# Patient Record
Sex: Female | Born: 1990 | Race: Black or African American | Hispanic: No | Marital: Married | Smoking: Never smoker
Health system: Southern US, Community
[De-identification: ages and names within clinical notes are randomized; demographics above are authoritative.]

## PROBLEM LIST (undated history)

## (undated) ENCOUNTER — Emergency Department (HOSPITAL_COMMUNITY): Disposition: A | Discharge status: Home / Self Care | Payer: Self-pay

## (undated) DIAGNOSIS — D219 Benign neoplasm of connective and other soft tissue, unspecified: Secondary | ICD-10-CM

## (undated) DIAGNOSIS — A6009 Herpesviral infection of other urogenital tract: Secondary | ICD-10-CM

## (undated) HISTORY — PX: NO PAST SURGERIES: SHX2092

---

## 2012-09-06 ENCOUNTER — Emergency Department (INDEPENDENT_AMBULATORY_CARE_PROVIDER_SITE_OTHER)
Admission: EM | Admit: 2012-09-06 | Discharge: 2012-09-06 | Disposition: A | Discharge status: Home / Self Care | Payer: Medicaid Other | Source: Home / Self Care | Attending: Emergency Medicine | Admitting: Emergency Medicine

## 2012-09-06 ENCOUNTER — Encounter (HOSPITAL_COMMUNITY): Payer: Self-pay

## 2012-09-06 ENCOUNTER — Other Ambulatory Visit: Payer: Self-pay | Admitting: Emergency Medicine

## 2012-09-06 ENCOUNTER — Other Ambulatory Visit (HOSPITAL_COMMUNITY)
Admission: RE | Admit: 2012-09-06 | Discharge: 2012-09-06 | Disposition: A | Discharge status: Home / Self Care | Payer: Medicaid Other | Source: Ambulatory Visit | Attending: Emergency Medicine | Admitting: Emergency Medicine

## 2012-09-06 DIAGNOSIS — N760 Acute vaginitis: Secondary | ICD-10-CM | POA: Insufficient documentation

## 2012-09-06 DIAGNOSIS — Z113 Encounter for screening for infections with a predominantly sexual mode of transmission: Secondary | ICD-10-CM | POA: Insufficient documentation

## 2012-09-06 LAB — POCT URINALYSIS DIP (DEVICE)
Glucose, UA: NEGATIVE mg/dL
Nitrite: NEGATIVE
Urobilinogen, UA: 1 mg/dL (ref 0.0–1.0)

## 2012-09-06 MED ORDER — AZITHROMYCIN 250 MG PO TABS
ORAL_TABLET | ORAL | Status: AC
  Filled 2012-09-06: qty 4

## 2012-09-06 MED ORDER — MICONAZOLE NITRATE 100-2 MG-% VA KIT: 1.0000 | PACK | Freq: Once | VAGINAL | Status: DC

## 2012-09-06 MED ORDER — METRONIDAZOLE 500 MG PO TABS: 500.0000 mg | ORAL_TABLET | Freq: Two times a day (BID) | ORAL | Status: DC

## 2012-09-06 MED ORDER — AZITHROMYCIN 250 MG PO TABS
1000.0000 mg | ORAL_TABLET | Freq: Once | ORAL | Status: AC
  Administered 2012-09-06: 1000 mg via ORAL

## 2012-09-06 NOTE — ED Provider Notes (Signed)
CSN: [id number]     Arrival date & time 09/06/12  0915 History   First MD Initiated Contact with Patient 09/06/12 1003     Chief Complaint  Patient presents with  . Vaginitis   (Consider location/radiation/quality/duration/timing/severity/associated sxs/prior Treatment) The history is provided by the patient. No language interpreter was used.  C/O VAGINAL ITCHING X 1 WEEK DENIES DYSURIA DENIES STD EXPOSURE JUST HAD HER MENSTRUAL PERIOD LAST WEEK  History reviewed. No pertinent past medical history. History reviewed. No pertinent past surgical history. No family history on file. History  Substance Use Topics  . Smoking status: Not on file  . Smokeless tobacco: Not on file  . Alcohol Use: Not on file   OB History   Grav Para Term Preterm Abortions TAB SAB Ect Mult Living                 Review of Systems  Constitutional: Negative.   HENT: Negative.   Eyes: Negative.   Respiratory: Negative.   Cardiovascular: Negative.   Gastrointestinal: Negative.   Endocrine: Negative.   Genitourinary: Positive for vaginal discharge.       C/O VAGINAL ITCHING AND DISCHARGE  Musculoskeletal: Negative.   Neurological: Negative.   Hematological: Negative.   Psychiatric/Behavioral: Negative.   All other systems reviewed and are negative.    Allergies  Penicillins  Home Medications   Current Outpatient Rx  Name  Route  Sig  Dispense  Refill  . metroNIDAZOLE (FLAGYL) 500 MG tablet   Oral   Take 1 tablet (500 mg total) by mouth 2 (two) times daily.   14 tablet   0   . Miconazole Nitrate (MONISTAT 7) 100-2 MG-% KIT   Vaginal   Place 1 Applicatorful vaginally once.   1 kit   0    BP 107/68  Pulse 84  Temp(Src) 98.3 F (36.8 C) (Oral)  Resp 14  SpO2 99%  LMP 09/01/2012 Physical Exam  Nursing note and vitals reviewed. Constitutional: She is oriented to person, place, and time. She appears well-developed and well-nourished.  HENT:  Head: Normocephalic and atraumatic.   Mouth/Throat: Oropharynx is clear and moist.  Eyes: Conjunctivae are normal. Pupils are equal, round, and reactive to light.  Neck: Normal range of motion. Neck supple.  Cardiovascular: Normal rate, regular rhythm, normal heart sounds and intact distal pulses.   No murmur heard. Pulmonary/Chest: Effort normal and breath sounds normal.  Abdominal: Soft. Bowel sounds are normal. She exhibits no distension and no mass. There is no tenderness.  Genitourinary:  NORMAL EXTERNAL GENITALIA RESIDUAL DARK MENSTRUAL BLOOD  MINIMAL IN VAULT CERVIX NORMAL NO DISCHARGE UTERUS SMALL,NONTENDER NO ADNEXAL MASS OR TENDERNESS  Musculoskeletal: Normal range of motion.  Neurological: She is alert and oriented to person, place, and time. No cranial nerve deficit. She exhibits normal muscle tone. Coordination normal.  Skin: Skin is warm and dry.  Psychiatric: She has a normal mood and affect.    ED Course  Procedures (including critical care time) Labs Review Labs Reviewed  POCT URINALYSIS DIP (DEVICE) - Abnormal; Notable for the following:    Hgb urine dipstick MODERATE (*)    Leukocytes, UA MODERATE (*)    All other components within normal limits  POCT PREGNANCY, URINE  CERVICOVAGINAL ANCILLARY ONLY   Imaging Review No results found.  MDM   1. Vaginitis       Duwayne Heck de Marcello Moores, MD 09/06/12 1200

## 2012-09-06 NOTE — ED Notes (Signed)
Call back number for lab issues verified 

## 2012-09-06 NOTE — ED Notes (Signed)
Uses tampons , and since her last cycle, has been having odor and itching; states her partner has not had issues; NAD

## 2012-09-09 NOTE — ED Notes (Signed)
GC/Chlamydia neg., Affirm: Candida pos., Gardnerella and Trich neg. Pt. adequately treated with Monistat -7. Lynn Cardenas 09/09/2012

## 2013-03-24 ENCOUNTER — Emergency Department (HOSPITAL_COMMUNITY)
Admission: EM | Admit: 2013-03-24 | Discharge: 2013-03-24 | Disposition: A | Discharge status: Home / Self Care | Payer: Medicaid Other | Source: Home / Self Care | Attending: Family Medicine | Admitting: Family Medicine

## 2013-03-24 ENCOUNTER — Encounter (HOSPITAL_COMMUNITY): Payer: Self-pay | Admitting: Emergency Medicine

## 2013-03-24 ENCOUNTER — Other Ambulatory Visit (HOSPITAL_COMMUNITY)
Admission: RE | Admit: 2013-03-24 | Discharge: 2013-03-24 | Disposition: A | Discharge status: Home / Self Care | Payer: Medicaid Other | Source: Ambulatory Visit | Attending: Family Medicine | Admitting: Family Medicine

## 2013-03-24 DIAGNOSIS — N898 Other specified noninflammatory disorders of vagina: Secondary | ICD-10-CM

## 2013-03-24 DIAGNOSIS — Z113 Encounter for screening for infections with a predominantly sexual mode of transmission: Secondary | ICD-10-CM | POA: Insufficient documentation

## 2013-03-24 DIAGNOSIS — N760 Acute vaginitis: Secondary | ICD-10-CM | POA: Insufficient documentation

## 2013-03-24 MED ORDER — FLUCONAZOLE 150 MG PO TABS: 150.0000 mg | ORAL_TABLET | Freq: Once | ORAL | Status: DC

## 2013-03-24 MED ORDER — FLUCONAZOLE 150 MG PO TABS: 150.0000 mg | ORAL_TABLET | Freq: Every day | ORAL | Status: DC

## 2013-03-24 NOTE — ED Provider Notes (Signed)
CSN: [id number]     Arrival date & time 03/24/13  1159 History   First MD Initiated Contact with Patient 03/24/13 1343     Chief Complaint  Patient presents with  . Vaginitis   (Consider location/radiation/quality/duration/timing/severity/associated sxs/prior Treatment) Patient is a 23 y.o. female presenting with vaginal itching. The history is provided by the patient.  Vaginal Itching This is a new problem. The current episode started 2 days ago. The problem occurs constantly. The problem has not changed since onset.Nothing aggravates the symptoms. Nothing relieves the symptoms.   Lynn Cardenas is a 23 y.o. female who presents to the UC with vaginal swelling, itching and discharge after having sex and using a latex condom 2 days ago. She has been with her current sex partner x 4 months and uses condoms every time. She thinks may this time it was a different kind of condom. She denies n/v, abdominal pain or any other problems.   History reviewed. No pertinent past medical history. History reviewed. No pertinent past surgical history. History reviewed. No pertinent family history. History  Substance Use Topics  . Smoking status: Not on file  . Smokeless tobacco: Not on file  . Alcohol Use: Not on file   OB History   Grav Para Term Preterm Abortions TAB SAB Ect Mult Living                 Review of Systems Negative except as stated in HPI Allergies  Penicillins  Home Medications   Current Outpatient Rx  Name  Route  Sig  Dispense  Refill  . fluconazole (DIFLUCAN) 150 MG tablet   Oral   Take 1 tablet (150 mg total) by mouth daily.   1 tablet   0   . metroNIDAZOLE (FLAGYL) 500 MG tablet   Oral   Take 1 tablet (500 mg total) by mouth 2 (two) times daily.   14 tablet   0   . Miconazole Nitrate (MONISTAT 7) 100-2 MG-% KIT   Vaginal   Place 1 Applicatorful vaginally once.   1 kit   0    BP 98/60  Pulse 67  Temp(Src) 98.2 F (36.8 C) (Oral)  Resp 16  SpO2 100%   LMP 03/03/2013 Physical Exam  Nursing note and vitals reviewed. Constitutional: She is oriented to person, place, and time. She appears well-developed and well-nourished. No distress.  HENT:  Head: Normocephalic.  Eyes: EOM are normal.  Neck: Neck supple.  Cardiovascular: Normal rate.   Pulmonary/Chest: Effort normal.  Abdominal: Soft. There is no tenderness.  Genitourinary:  External genitalia without lesions, no swelling noted, thick white discharge vaginal vault. No CMT, no adnexal tenderness. Uterus without palpable enlargement.   Musculoskeletal: Normal range of motion.  Neurological: She is alert and oriented to person, place, and time. No cranial nerve deficit.  Skin: Skin is warm and dry.  Psychiatric: She has a normal mood and affect. Her behavior is normal.    ED Course  Procedures  MDM  23 y.o. female with vaginal irritation and vaginal discharge. Will treat with Diflucan while wet prep and cultures pending.  Discussed with the patient and all questioned fully answered. She will follow up with her GYN or return here if any problems arise. Stable for discharge without further screening indicated at this time  1. Vaginal discharge        [facility], NP 03/24/13 1414

## 2013-03-24 NOTE — ED Provider Notes (Signed)
Medical screening examination/treatment/procedure(s) were performed by resident physician or non-physician practitioner and as supervising physician I was immediately available for consultation/collaboration.   Pauline Good MD.   Billy Fischer, MD 03/24/13 [phone]

## 2013-03-24 NOTE — Discharge Instructions (Signed)
Your exam today shows minimal swelling of the vaginal area. The condom that was used when you had sex may have caused irritation to the vaginal area. There is a thick white discharge that may be a yeast infection. I am giving you medication that will treat yeast. Follow up with your GYN. If your cultures show that you need additional medications someone will call you.

## 2013-03-24 NOTE — ED Notes (Signed)
C/o yeast infection States she is not having any sx but area is swelling Has had sex recently Has used monistat but no relief.

## 2013-03-25 LAB — CERVICOVAGINAL ANCILLARY ONLY
Chlamydia: NEGATIVE
NEISSERIA GONORRHEA: NEGATIVE
WET PREP (BD AFFIRM): NEGATIVE
WET PREP (BD AFFIRM): POSITIVE — AB
Wet Prep (BD Affirm): NEGATIVE

## 2013-03-26 NOTE — ED Notes (Addendum)
Gc/Chlamydia neg., Affirm: Candida and Trich neg., Gardnerella pos.  Message sent to Dr. Juventino Slovak and Debroah Baller NP. Lynn Cardenas 03/26/2013 3/19 Discussed with Dr. Juventino Slovak and he said no further action needed. 03/27/2013

## 2013-03-29 ENCOUNTER — Telehealth (HOSPITAL_COMMUNITY): Payer: Self-pay | Admitting: *Deleted

## 2013-03-29 NOTE — ED Notes (Signed)
Lynn Baller NP ordered Flagyl.  I called pt.  Pt. verified x 2 and given results.  Pt. told she needs Flagyl for bacterial vaginosis.   Pt. instructed to no alcohol while taking this medication.  Pt. wants Rx. called to [facility] on [address]. I called Rx. to pharmacy VM @ [phone] because the pharmacy was closed. Lynn Cardenas 03/29/2013

## 2013-12-11 ENCOUNTER — Other Ambulatory Visit: Payer: Self-pay | Admitting: Emergency Medicine

## 2013-12-11 DIAGNOSIS — R102 Pelvic and perineal pain: Secondary | ICD-10-CM

## 2013-12-11 DIAGNOSIS — N940 Mittelschmerz: Secondary | ICD-10-CM

## 2013-12-15 ENCOUNTER — Ambulatory Visit
Admission: RE | Admit: 2013-12-15 | Discharge: 2013-12-15 | Disposition: A | Discharge status: Home / Self Care | Payer: Medicaid Other | Source: Ambulatory Visit | Attending: Emergency Medicine | Admitting: Emergency Medicine

## 2013-12-15 ENCOUNTER — Other Ambulatory Visit: Payer: Medicaid Other

## 2013-12-15 ENCOUNTER — Inpatient Hospital Stay: Admission: RE | Admit: 2013-12-15 | Payer: Medicaid Other | Source: Ambulatory Visit

## 2013-12-15 DIAGNOSIS — N940 Mittelschmerz: Secondary | ICD-10-CM

## 2013-12-15 DIAGNOSIS — R102 Pelvic and perineal pain unspecified side: Secondary | ICD-10-CM

## 2014-10-27 ENCOUNTER — Emergency Department (INDEPENDENT_AMBULATORY_CARE_PROVIDER_SITE_OTHER)
Admission: EM | Admit: 2014-10-27 | Discharge: 2014-10-27 | Disposition: A | Discharge status: Home / Self Care | Payer: Self-pay | Source: Home / Self Care | Attending: Family Medicine | Admitting: Family Medicine

## 2014-10-27 ENCOUNTER — Encounter (HOSPITAL_COMMUNITY): Payer: Self-pay | Admitting: Emergency Medicine

## 2014-10-27 ENCOUNTER — Emergency Department (INDEPENDENT_AMBULATORY_CARE_PROVIDER_SITE_OTHER): Payer: Self-pay

## 2014-10-27 DIAGNOSIS — R3589 Other polyuria: Secondary | ICD-10-CM

## 2014-10-27 DIAGNOSIS — R358 Other polyuria: Secondary | ICD-10-CM

## 2014-10-27 DIAGNOSIS — R109 Unspecified abdominal pain: Secondary | ICD-10-CM

## 2014-10-27 LAB — POCT URINALYSIS DIP (DEVICE)
BILIRUBIN URINE: NEGATIVE
GLUCOSE, UA: NEGATIVE mg/dL
Hgb urine dipstick: NEGATIVE
KETONES UR: NEGATIVE mg/dL
Leukocytes, UA: NEGATIVE
NITRITE: NEGATIVE
PH: 7.5 (ref 5.0–8.0)
Protein, ur: NEGATIVE mg/dL
Specific Gravity, Urine: 1.015 (ref 1.005–1.030)
Urobilinogen, UA: 0.2 mg/dL (ref 0.0–1.0)

## 2014-10-27 LAB — POCT PREGNANCY, URINE: PREG TEST UR: NEGATIVE

## 2014-10-27 MED ORDER — NAPROXEN 500 MG PO TABS
500.0000 mg | ORAL_TABLET | Freq: Two times a day (BID) | ORAL | Status: DC
Start: 2014-10-27 — End: 2019-12-16

## 2014-10-27 NOTE — ED Provider Notes (Signed)
CSN: [id number]     Arrival date & time 10/27/14  1541 History   First MD Initiated Contact with Patient 10/27/14 1746     Chief Complaint  Patient presents with  . Urinary Tract Infection  . Abdominal Pain   (Consider location/radiation/quality/duration/timing/severity/associated sxs/prior Treatment) Patient is a 24 y.o. female presenting with urinary tract infection and abdominal pain. The history is provided by the patient. No language interpreter was used.  Urinary Tract Infection Associated symptoms: abdominal pain   Abdominal Pain Patient presents with complait of 2 weeks' duration urinary frequency and feeling incomplete emptying, accompanied by L sided abdominal pain that is worse when she voids.  Has had no fever/chills, no dysuria and no vaginal discharge. No prior history of UTI. The abd pain is gnawing and not sharp, is not affected by position.  No associated N/V/D.  Has not seen blood in urine. LMP 09/26/2014, usual timing for her.  She has no abd surgical history.  She has a history of ovarian cyst in the past, which was much sharper pain than she has now.  At the present moment she is pain-free.   No history of renal calculi.  She takes no medications, did take an alka seltzer 2 weeks ago (x1) for cold symptoms.  Allergy to PCN (only medication allergy).   History reviewed. No pertinent past medical history. History reviewed. No pertinent past surgical history. History reviewed. No pertinent family history. Social History  Substance Use Topics  . Smoking status: None  . Smokeless tobacco: None  . Alcohol Use: None   OB History    No data available     Review of Systems  Gastrointestinal: Positive for abdominal pain.    Allergies  Penicillins  Home Medications   Prior to Admission medications   Medication Sig Start Date End Date Taking? Authorizing Provider  fluconazole (DIFLUCAN) 150 MG tablet Take 1 tablet (150 mg total) by mouth daily. 03/24/13   Hope Bunnie Pion, NP  metroNIDAZOLE (FLAGYL) 500 MG tablet Take 1 tablet (500 mg total) by mouth 2 (two) times daily. 09/06/12   Quentin Angst de [place], MD  Miconazole Nitrate (MONISTAT 7) 100-2 MG-% KIT Place 1 Applicatorful vaginally once. 09/06/12   [place], MD   Meds Ordered and Administered this Visit  Medications - No data to display  BP 105/67 mmHg  Pulse 73  Temp(Src) 98.6 F (37 C) (Oral)  Resp 18  SpO2 99%  LMP 09/26/2014 No data found.   Physical Exam  Constitutional: She appears well-developed and well-nourished. No distress.  HENT:  Head: Normocephalic.  Mouth/Throat: Oropharynx is clear and moist. No oropharyngeal exudate.  Eyes: Conjunctivae are normal. Pupils are equal, round, and reactive to light. Right eye exhibits no discharge. Left eye exhibits no discharge. No scleral icterus.  Neck: Normal range of motion. Neck supple. No thyromegaly present.  Cardiovascular: Normal rate, regular rhythm and normal heart sounds.   No murmur heard. Pulmonary/Chest: Effort normal and breath sounds normal. No respiratory distress. She has no wheezes. She has no rales. She exhibits no tenderness.  Abdominal: Soft. Bowel sounds are normal.  Left sided CVA tenderness noted.  No masses or megaly.  Tenderness (mild) to deep palpation along LLQ.  No rebound.  No suprapubic tenderness.   Lymphadenopathy:    She has no cervical adenopathy.  Skin: She is not diaphoretic.    ED Course  Procedures (including critical care time)  Labs Review Labs Reviewed  POCT  URINALYSIS DIP (DEVICE)  POCT PREGNANCY, URINE    Imaging Review No results found.   Visual Acuity Review  Right Eye Distance:   Left Eye Distance:   Bilateral Distance:    Right Eye Near:   Left Eye Near:    Bilateral Near:         MDM   1. Left sided abdominal pain   2. Polyuria    Patient with polyuria and normal UA in UCC today; the combination of her urinary symptoms and LLQ abdominal pain  (intermittent) with some mild CVA tenderness raises concerns for renal calculi.  KUB in [place] today to see if we can visualize radio-opaque stone.  If not, does not exclude diagnosis of renal stones. WIll recommend increased hydration and Naproxen 544m twice daily for pain. Patient is to establish with primary care provider, [facility]     JWilleen Niece MD 10/27/14 [phone]

## 2014-10-27 NOTE — ED Notes (Signed)
The patient presented to the [facility] with a complaint of a possible UTI and stomach pain that has been ongoing for 2 weeks.

## 2014-10-27 NOTE — Discharge Instructions (Signed)
It was a pleasure to see you today.  The x-ray does not show evidence of a kidney stone.   I recommend increased fluid intake; use of Naproxen 500mg  tablet, 1 by mouth every 12 hours with food, as needed.   I recommend establishing care with a primary care doctor.   Please return to the [facility] or the ED if you experience worsening of the pain, nausea/vomiting, fevers, or with other changes or concerns.

## 2018-11-11 DIAGNOSIS — Z139 Encounter for screening, unspecified: Secondary | ICD-10-CM | POA: Diagnosis not present

## 2018-11-11 DIAGNOSIS — E559 Vitamin D deficiency, unspecified: Secondary | ICD-10-CM | POA: Diagnosis not present

## 2018-11-11 DIAGNOSIS — Z01419 Encounter for gynecological examination (general) (routine) without abnormal findings: Secondary | ICD-10-CM | POA: Diagnosis not present

## 2018-11-11 DIAGNOSIS — Z3040 Encounter for surveillance of contraceptives, unspecified: Secondary | ICD-10-CM | POA: Diagnosis not present

## 2018-11-13 DIAGNOSIS — M9902 Segmental and somatic dysfunction of thoracic region: Secondary | ICD-10-CM | POA: Diagnosis not present

## 2018-11-13 DIAGNOSIS — M7541 Impingement syndrome of right shoulder: Secondary | ICD-10-CM | POA: Diagnosis not present

## 2018-11-13 DIAGNOSIS — M542 Cervicalgia: Secondary | ICD-10-CM | POA: Diagnosis not present

## 2018-11-13 DIAGNOSIS — M9901 Segmental and somatic dysfunction of cervical region: Secondary | ICD-10-CM | POA: Diagnosis not present

## 2018-11-17 DIAGNOSIS — M542 Cervicalgia: Secondary | ICD-10-CM | POA: Diagnosis not present

## 2018-11-17 DIAGNOSIS — M9902 Segmental and somatic dysfunction of thoracic region: Secondary | ICD-10-CM | POA: Diagnosis not present

## 2018-11-17 DIAGNOSIS — M9901 Segmental and somatic dysfunction of cervical region: Secondary | ICD-10-CM | POA: Diagnosis not present

## 2018-11-17 DIAGNOSIS — M7541 Impingement syndrome of right shoulder: Secondary | ICD-10-CM | POA: Diagnosis not present

## 2018-11-24 DIAGNOSIS — M9902 Segmental and somatic dysfunction of thoracic region: Secondary | ICD-10-CM | POA: Diagnosis not present

## 2018-11-24 DIAGNOSIS — M542 Cervicalgia: Secondary | ICD-10-CM | POA: Diagnosis not present

## 2018-11-24 DIAGNOSIS — M7541 Impingement syndrome of right shoulder: Secondary | ICD-10-CM | POA: Diagnosis not present

## 2018-11-24 DIAGNOSIS — M9901 Segmental and somatic dysfunction of cervical region: Secondary | ICD-10-CM | POA: Diagnosis not present

## 2018-12-29 DIAGNOSIS — Z03818 Encounter for observation for suspected exposure to other biological agents ruled out: Secondary | ICD-10-CM | POA: Diagnosis not present

## 2019-06-15 DIAGNOSIS — Z03818 Encounter for observation for suspected exposure to other biological agents ruled out: Secondary | ICD-10-CM | POA: Diagnosis not present

## 2019-10-27 DIAGNOSIS — N925 Other specified irregular menstruation: Secondary | ICD-10-CM | POA: Diagnosis not present

## 2019-10-27 DIAGNOSIS — Z124 Encounter for screening for malignant neoplasm of cervix: Secondary | ICD-10-CM | POA: Diagnosis not present

## 2019-10-27 DIAGNOSIS — Z113 Encounter for screening for infections with a predominantly sexual mode of transmission: Secondary | ICD-10-CM | POA: Diagnosis not present

## 2019-10-27 DIAGNOSIS — E559 Vitamin D deficiency, unspecified: Secondary | ICD-10-CM | POA: Diagnosis not present

## 2019-10-27 DIAGNOSIS — N912 Amenorrhea, unspecified: Secondary | ICD-10-CM | POA: Diagnosis not present

## 2019-10-27 LAB — OB RESULTS CONSOLE RPR: RPR: NONREACTIVE

## 2019-10-27 LAB — OB RESULTS CONSOLE RUBELLA ANTIBODY, IGM: Rubella: IMMUNE

## 2019-10-27 LAB — OB RESULTS CONSOLE ABO/RH: RH Type: POSITIVE

## 2019-10-27 LAB — OB RESULTS CONSOLE HIV ANTIBODY (ROUTINE TESTING): HIV: NONREACTIVE

## 2019-10-28 LAB — OB RESULTS CONSOLE GC/CHLAMYDIA
Chlamydia: NEGATIVE
Gonorrhea: NEGATIVE

## 2019-11-03 DIAGNOSIS — O4691 Antepartum hemorrhage, unspecified, first trimester: Secondary | ICD-10-CM | POA: Diagnosis not present

## 2019-11-23 DIAGNOSIS — Z331 Pregnant state, incidental: Secondary | ICD-10-CM | POA: Diagnosis not present

## 2019-12-16 ENCOUNTER — Inpatient Hospital Stay (HOSPITAL_COMMUNITY)
Admission: AD | Admit: 2019-12-16 | Discharge: 2019-12-16 | Disposition: A | Discharge status: Home / Self Care | Payer: BC Managed Care – PPO | Attending: Obstetrics and Gynecology | Admitting: Obstetrics and Gynecology

## 2019-12-16 ENCOUNTER — Encounter (HOSPITAL_COMMUNITY): Payer: Self-pay | Admitting: Obstetrics and Gynecology

## 2019-12-16 ENCOUNTER — Other Ambulatory Visit: Payer: Self-pay

## 2019-12-16 DIAGNOSIS — Z79899 Other long term (current) drug therapy: Secondary | ICD-10-CM | POA: Diagnosis not present

## 2019-12-16 DIAGNOSIS — Z791 Long term (current) use of non-steroidal anti-inflammatories (NSAID): Secondary | ICD-10-CM | POA: Insufficient documentation

## 2019-12-16 DIAGNOSIS — O26899 Other specified pregnancy related conditions, unspecified trimester: Secondary | ICD-10-CM

## 2019-12-16 DIAGNOSIS — Z880 Allergy status to penicillin: Secondary | ICD-10-CM | POA: Insufficient documentation

## 2019-12-16 DIAGNOSIS — Z3A14 14 weeks gestation of pregnancy: Secondary | ICD-10-CM | POA: Diagnosis not present

## 2019-12-16 DIAGNOSIS — O26892 Other specified pregnancy related conditions, second trimester: Secondary | ICD-10-CM

## 2019-12-16 DIAGNOSIS — R109 Unspecified abdominal pain: Secondary | ICD-10-CM | POA: Diagnosis not present

## 2019-12-16 LAB — URINALYSIS, ROUTINE W REFLEX MICROSCOPIC
Bilirubin Urine: NEGATIVE
Glucose, UA: NEGATIVE mg/dL
Hgb urine dipstick: NEGATIVE
Ketones, ur: 5 mg/dL — AB
Leukocytes,Ua: NEGATIVE
Nitrite: NEGATIVE
Protein, ur: NEGATIVE mg/dL
Specific Gravity, Urine: 1.005 (ref 1.005–1.030)
pH: 7 (ref 5.0–8.0)

## 2019-12-16 NOTE — Discharge Instructions (Signed)
Abdominal Pain During Pregnancy  Belly (abdominal) pain is common during pregnancy. There are many possible causes. Most of the time, it is not a serious problem. Other times, it can be a sign that something is wrong with the pregnancy. Always tell your doctor if you have belly pain. Follow these instructions at home:  Do not have sex or put anything in your vagina until your pain goes away completely.  Get plenty of rest until your pain gets better.  Drink enough fluid to keep your pee (urine) pale yellow.  Take over-the-counter and prescription medicines only as told by your doctor.  Keep all follow-up visits as told by your doctor. This is important. Contact a doctor if:  Your pain continues or gets worse after resting.  You have lower belly pain that: ? Comes and goes at regular times. ? Spreads to your back. ? Feels like menstrual cramps.  You have pain or burning when you pee (urinate). Get help right away if:  You have a fever or chills.  You have vaginal bleeding.  You are leaking fluid from your vagina.  You are passing tissue from your vagina.  You throw up (vomit) for more than 24 hours.  You have watery poop (diarrhea) for more than 24 hours.  Your baby is moving less than usual.  You feel very weak or faint.  You have shortness of breath.  You have very bad pain in your upper belly. Summary  Belly (abdominal) pain is common during pregnancy. There are many possible causes.  If you have belly pain during pregnancy, tell your doctor right away.  Keep all follow-up visits as told by your doctor. This is important. This information is not intended to replace advice given to you by your health care provider. Make sure you discuss any questions you have with your health care provider. Document Revised: 04/14/2018 Document Reviewed: 03/29/2016 Elsevier Patient Education  2020 Elsevier Inc.  

## 2019-12-16 NOTE — MAU Provider Note (Addendum)
History   Pt seen prenatally at [place] with adequate visits up to this point.  Mild vaginal bleeding 11/03/2019, seen at the office- resolved.   10/27/2019  -Urine Preg- Pos -Urine Culture-  No growth -ABO Grouping: O -Rh Factor- Pos RPR: Non-Reactive 10/29/2019 -Chlamydia/Urine- Neg -Gonorrhea/Urine- Neg 11/03/2019 -HCG Beta- 98899 mIU/mL  CSN: [id number]  Arrival date and time: 12/16/19 1842   None     Chief Complaint  Patient presents with   Abdominal Pain   HPI  Pt is a 29 yo G1 14.4 weeks.  Currently complaining of abdominal cramping that started this morning, tried tylenol around 1600 and states that it did not help.  Pt denies vag bleeding/discharge or dysuria.  Positive heart tones heard during this visit.   Pertinent Gynecological History:   No past medical history on file.  No past surgical history on file.  No family history on file.  Social History   Tobacco Use   Smoking status: Not on file  Substance Use Topics   Alcohol use: Not on file   Drug use: Not on file    Allergies:  Allergies  Allergen Reactions   Penicillins     Medications Prior to Admission  Medication Sig Dispense Refill Last Dose   fluconazole (DIFLUCAN) 150 MG tablet Take 1 tablet (150 mg total) by mouth daily. 1 tablet 0    metroNIDAZOLE (FLAGYL) 500 MG tablet Take 1 tablet (500 mg total) by mouth 2 (two) times daily. 14 tablet 0    Miconazole Nitrate (MONISTAT 7) 100-2 MG-% KIT Place 1 Applicatorful vaginally once. 1 kit 0    naproxen (NAPROSYN) 500 MG tablet Take 1 tablet (500 mg total) by mouth 2 (two) times daily with a meal. 30 tablet 0     Review of Systems No other pertinents other than what is listed in HPI. Physical Exam   Blood pressure 110/60, pulse 90, temperature 98.5 F (36.9 C), resp. rate 16, height 5' (1.524 m), weight 52.2 kg.  Physical Exam HENT:     Head: Normocephalic.  Cardiovascular:     Rate and Rhythm: Normal rate.   Pulmonary:     Effort: Pulmonary effort is normal.  Abdominal:     Palpations: Abdomen is soft.  Skin:    General: Skin is warm and dry.  Neurological:     Mental Status: She is alert.  Psychiatric:        Mood and Affect: Mood normal.        Behavior: Behavior normal.     FHTs: 150 bpm Cervical Exam: Long/Thick/Closed Urinalysis    Component Value Date/Time   COLORURINE YELLOW 12/16/2019 1952   APPEARANCEUR CLEAR 12/16/2019 1952   LABSPEC 1.005 12/16/2019 [place] 7.0 12/16/2019 1952   GLUCOSEU NEGATIVE 12/16/2019 1952   HGBUR NEGATIVE 12/16/2019 [place] NEGATIVE 12/16/2019 1952   KETONESUR 5 (A) 12/16/2019 1952   PROTEINUR NEGATIVE 12/16/2019 1952   UROBILINOGEN 0.2 10/27/2014 1750   NITRITE NEGATIVE 12/16/2019 1952   LEUKOCYTESUR NEGATIVE 12/16/2019 1952    MAU Course  Procedures None  MDM -UA collected -SVE  Assessment and Plan  -Discharge to home, return with increased cramping, vaginal bleeding, fever, or chills -Tylenol 1000 mg PO q 6-8 hours as needed, not to exceed 3000 mg per day -Increase fluid intake  -Keep regular office visit 12/21/2019  Wyatt Haste, SNM 12/16/2019, 9:42 PM   CNM attestation:  I have seen and examined this patient; I agree  with above documentation in the student midwife's note.   Rettie Laird is a 29 y.o. G1P0 reporting abd cramping today, unrelieved by Tylenol. Denies LOF, VB, contractions, vaginal discharge. Had a NOB visit and u/s at [place] already with neg tests for vag infection.  PE: BP 110/60    Pulse 90    Temp 98.5 F (36.9 C)    Resp 16    Ht 5' (1.524 m)    Wt 52.2 kg    BMI 22.46 kg/m  Gen: calm comfortable, NAD Resp: normal effort, no distress Abd: gravid  ROS, labs, PMH reviewed FHTs dopplered 150s Cx C/L  Plan: - d/c home with abd pain precautions; may use Tylenol or an intermittent dose of Motrin only in 2nd trimester - continue routine follow up in [facility] clinic  Myrtis Ser, CNM 10:09 PM  12/16/2019

## 2019-12-16 NOTE — MAU Note (Signed)
Have had abd cramping all day. Called office and told to take Tylenol and if it did not help to come to hosp. Cramping continues. Denies VB or d/c

## 2019-12-30 DIAGNOSIS — Z03818 Encounter for observation for suspected exposure to other biological agents ruled out: Secondary | ICD-10-CM | POA: Diagnosis not present

## 2020-01-07 DIAGNOSIS — U071 COVID-19: Secondary | ICD-10-CM | POA: Diagnosis not present

## 2020-01-09 NOTE — L&D Delivery Note (Signed)
Delivery Note Labor onset: 05/30/2020  Labor Onset Time: 1130 Complete dilation at 8:20 PM  Onset of pushing at 2020 FHR second stage Cat 1 Analgesia/Anesthesia intrapartum: Unmedicated  Spontaneous pushing with maternal urge while standing in the shower. Delivery of a viable female at 2029. Fetal head delivered in OA position.  Nuchal cord: none.  Infant placed on maternal abd, dried, and tactile stim.  Cord double clamped after no and cut by Father.  Father, pt's mother-in-law, and doula present for birth.  Cord blood sample collected: Yes Arterial cord blood sample collected: N/A  Placenta delivered Delena Bali, intact, with 3 VC.  Placenta to path for Covid during pregnancy. Uterine tone firm w/ palpable fibroid, bleeding small  1st degree perineal laceration identified.  Anesthesia: 1% Lidocaine Repair: 4-0 Vicryl  QBL/EBL (mL): 681 Complications: none APGAR: APGAR (1 MIN): 8  APGAR (5 MINS):  9 APGAR (10 MINS):   Mom to postpartum.  Baby to Couplet care / Skin to Skin. Baby girl "Osha"  Arrie Eastern MSN, CNM 05/30/2020, 9:51 PM

## 2020-03-06 ENCOUNTER — Encounter (HOSPITAL_COMMUNITY): Payer: Self-pay | Admitting: Obstetrics & Gynecology

## 2020-03-06 ENCOUNTER — Other Ambulatory Visit: Payer: Self-pay

## 2020-03-06 ENCOUNTER — Inpatient Hospital Stay (HOSPITAL_COMMUNITY): Payer: BC Managed Care – PPO

## 2020-03-06 ENCOUNTER — Inpatient Hospital Stay (HOSPITAL_COMMUNITY)
Admission: AD | Admit: 2020-03-06 | Discharge: 2020-03-06 | Disposition: A | Discharge status: Home / Self Care | Payer: BC Managed Care – PPO | Attending: Obstetrics & Gynecology | Admitting: Obstetrics & Gynecology

## 2020-03-06 DIAGNOSIS — R109 Unspecified abdominal pain: Secondary | ICD-10-CM | POA: Diagnosis not present

## 2020-03-06 DIAGNOSIS — O26892 Other specified pregnancy related conditions, second trimester: Secondary | ICD-10-CM | POA: Insufficient documentation

## 2020-03-06 DIAGNOSIS — Z3A26 26 weeks gestation of pregnancy: Secondary | ICD-10-CM | POA: Insufficient documentation

## 2020-03-06 DIAGNOSIS — R188 Other ascites: Secondary | ICD-10-CM | POA: Diagnosis not present

## 2020-03-06 DIAGNOSIS — Z3A23 23 weeks gestation of pregnancy: Secondary | ICD-10-CM | POA: Diagnosis not present

## 2020-03-06 DIAGNOSIS — O3412 Maternal care for benign tumor of corpus uteri, second trimester: Secondary | ICD-10-CM | POA: Diagnosis not present

## 2020-03-06 DIAGNOSIS — K429 Umbilical hernia without obstruction or gangrene: Secondary | ICD-10-CM | POA: Diagnosis not present

## 2020-03-06 DIAGNOSIS — D250 Submucous leiomyoma of uterus: Secondary | ICD-10-CM | POA: Insufficient documentation

## 2020-03-06 DIAGNOSIS — N1330 Unspecified hydronephrosis: Secondary | ICD-10-CM | POA: Diagnosis not present

## 2020-03-06 DIAGNOSIS — Z79899 Other long term (current) drug therapy: Secondary | ICD-10-CM | POA: Diagnosis not present

## 2020-03-06 LAB — URINALYSIS, ROUTINE W REFLEX MICROSCOPIC
Bilirubin Urine: NEGATIVE
Glucose, UA: NEGATIVE mg/dL
Hgb urine dipstick: NEGATIVE
Ketones, ur: NEGATIVE mg/dL
Leukocytes,Ua: NEGATIVE
Nitrite: NEGATIVE
Protein, ur: NEGATIVE mg/dL
Specific Gravity, Urine: 1.004 — ABNORMAL LOW (ref 1.005–1.030)
pH: 7 (ref 5.0–8.0)

## 2020-03-06 MED ORDER — OXYCODONE-ACETAMINOPHEN 5-325 MG PO TABS
2.0000 | ORAL_TABLET | ORAL | 0 refills | Status: DC | PRN
Start: 2020-03-06 — End: 2020-05-27

## 2020-03-06 MED ORDER — HYDROMORPHONE HCL 1 MG/ML IJ SOLN
0.5000 mg | Freq: Once | INTRAMUSCULAR | Status: AC
  Administered 2020-03-06: 0.5 mg via INTRAVENOUS
  Filled 2020-03-06: qty 1

## 2020-03-06 NOTE — Discharge Instructions (Signed)

## 2020-03-06 NOTE — MAU Provider Note (Addendum)
Patient Anabia Weatherwax is a 30 y.o. G1P0  at [redacted]w[redacted]d here with complaints of abdominal pain that started 24 hours ago. She denies vaginal bleeding, LOF, decreased fetal movements. She denies problems with blood pressure or blood sugar in this pregnancy. She denies contractions. She denies vomiting, nausea, fever, constipation.   She appears distressed and uncomfortable in bed.  History     CSN: [id number]  Arrival date and time: 03/06/20 0608      Chief Complaint  Patient presents with   Abdominal Pain   Abdominal Pain This is a new problem. The current episode started yesterday. The problem occurs intermittently. The problem has been gradually worsening. The pain is at a severity of 7/10. The abdominal pain does not radiate. Pertinent negatives include no constipation, diarrhea, dysuria, nausea or vomiting. Nothing aggravates the pain. The pain is relieved by nothing.    OB History    Gravida  1   Para      Term      Preterm      AB      Living        SAB      IAB      Ectopic      Multiple      Live Births              History reviewed. No pertinent past medical history.  History reviewed. No pertinent surgical history.  History reviewed. No pertinent family history.  Social History   Tobacco Use   Smoking status: Never Smoker   Smokeless tobacco: Never Used  Substance Use Topics   Alcohol use: Never   Drug use: Never    Allergies:  Allergies  Allergen Reactions   Penicillins     Medications Prior to Admission  Medication Sig Dispense Refill Last Dose   Prenatal Vit-Fe Fumarate-FA (PRENATAL MULTIVITAMIN) TABS tablet Take 1 tablet by mouth daily at 12 noon.   03/05/2020 at Unknown time    Review of Systems  Constitutional: Negative.   HENT: Negative.   Respiratory: Negative.   Gastrointestinal: Positive for abdominal pain. Negative for constipation, diarrhea, nausea and vomiting.  Genitourinary: Negative.  Negative for dysuria.   Neurological: Negative.    Physical Exam   Blood pressure 115/70, pulse 89, temperature 98.3 F (36.8 C), temperature source Oral, resp. rate 18, height 5\' 8"  (1.727 m), weight 54.4 kg, SpO2 100 %.  Physical Exam Constitutional:      Appearance: She is well-developed.  HENT:     Head: Normocephalic.  Abdominal:     General: Abdomen is flat.     Palpations: Abdomen is rigid. There is mass.     Tenderness: There is abdominal tenderness.  Neurological:     Mental Status: She is alert.   Abdominal mass noted directly above belly button; hardeded and tender to touch.   MAU Course  Procedures Results for orders placed or performed during the hospital encounter of 03/06/20 (from the past 24 hour(s))  Urinalysis, Routine w reflex microscopic Urine, Clean Catch     Status: Abnormal   Collection Time: 03/06/20  6:40 AM  Result Value Ref Range   Color, Urine STRAW (A) YELLOW   APPearance CLEAR CLEAR   Specific Gravity, Urine 1.004 (L) 1.005 - 1.030   pH 7.0 5.0 - 8.0   Glucose, UA NEGATIVE NEGATIVE mg/dL   Hgb urine dipstick NEGATIVE NEGATIVE   Bilirubin Urine NEGATIVE NEGATIVE   Ketones, ur NEGATIVE NEGATIVE mg/dL   Protein, ur  NEGATIVE NEGATIVE mg/dL   Nitrite NEGATIVE NEGATIVE   Leukocytes,Ua NEGATIVE NEGATIVE   MR PELVIS WO CONTRAST  Result Date: 03/06/2020 CLINICAL DATA:  Umbilical hernia [redacted] weeks pregnant in acute abdominal pain. EXAM: MRI PELVIS WITHOUT CONTRAST TECHNIQUE: Multiplanar multisequence MR imaging of the pelvis was performed. No intravenous contrast was administered. COMPARISON:  Pelvic ultrasound December 15, 2013. FINDINGS: Urinary Tract: Moderate right and mild left hydronephrosis, to the level of the gravid uterus. Bowel:  Unremarkable visualized bowel loops. Vascular/Lymphatic: No pathologically enlarged lymph nodes. No significant vascular abnormality seen. Reproductive: Gravid uterus. Study is not optimized for the evaluation of intrauterine placenta. There is  a 4.1 x 3.2 cm submucosal mass extending from the anterior aspect of the uterus which demonstrates heterogeneous T2 and T1 signal. Other: Trace ascites. There is extension of the uterine mass into mild diastasis rectus. Musculoskeletal: No suspicious bone lesions identified. IMPRESSION: 1. There is a 4.1 x 3.2 cm submucosal mass extending from the anterior aspect of the uterus which demonstrates heterogeneous T2 and T1 signal, with extension of the uterine mass into mild diastasis rectus. Findings are suspicious for a degenerating fibroid. 2. Gestational moderate right and mild left hydronephrosis, to the level of the gravid uterus. 3. Gravid uterus, however study is not optimized for the evaluation of intrauterine pregnancy or placenta. Electronically Signed   By: Dahlia Bailiff MD   On: 03/06/2020 11:49   MDM -NST: 150 bpm, mod var, present acel, no decels, uterine irratability -Due to patient's complaint and severity of pain, d/w Dr. Elly Modena who finds it appropriate to order MRI for incarcerated/strangulated hernia.   Reviewed results with patient and support person. Discussed pain management at length. CNM for [facility] called and information given to coordinate in office appointment this week.   Assessment and Plan   1. Submucous leiomyoma of uterus   2. [redacted] weeks gestation of pregnancy    -Discharge home in stable condition -Rx for percocet sent to patient's pharmacy -Abdominal pain precautions discussed -Patient advised to follow-up with OB this week for reevaluation -Patient may return to MAU as needed or if her condition were to change or worsen    Wende Mott CNM 03/06/2020, 12:44 PM

## 2020-03-06 NOTE — MAU Note (Signed)
..  Lynn Cardenas is a 30 y.o. at [redacted]w[redacted]d here in MAU reporting: Abdominal pain near her umbilicus that comes and goes, she states she has a fibroid there and is not sure if the pain is related to it or not. The pain has been there since yesterday morning. Has not taken anything for the pain. +FM. Denies LOF or vaginal bleeding.   Pain score: 7/10 Vitals:   03/06/20 0629  BP: 115/68  Pulse: 84  Resp: 18  Temp: 98.4 F (36.9 C)  SpO2: 99%      Lab orders placed from triage: UA

## 2020-03-22 DIAGNOSIS — Z3A28 28 weeks gestation of pregnancy: Secondary | ICD-10-CM | POA: Diagnosis not present

## 2020-03-22 DIAGNOSIS — O365999 Maternal care for other known or suspected poor fetal growth, unspecified trimester, other fetus: Secondary | ICD-10-CM | POA: Diagnosis not present

## 2020-03-22 DIAGNOSIS — Z3686 Encounter for antenatal screening for cervical length: Secondary | ICD-10-CM | POA: Diagnosis not present

## 2020-04-07 DIAGNOSIS — Z23 Encounter for immunization: Secondary | ICD-10-CM | POA: Diagnosis not present

## 2020-04-11 DIAGNOSIS — M5489 Other dorsalgia: Secondary | ICD-10-CM | POA: Diagnosis not present

## 2020-04-18 DIAGNOSIS — Z3493 Encounter for supervision of normal pregnancy, unspecified, third trimester: Secondary | ICD-10-CM | POA: Diagnosis not present

## 2020-04-18 DIAGNOSIS — O99019 Anemia complicating pregnancy, unspecified trimester: Secondary | ICD-10-CM | POA: Diagnosis not present

## 2020-04-18 DIAGNOSIS — J302 Other seasonal allergic rhinitis: Secondary | ICD-10-CM | POA: Diagnosis not present

## 2020-04-18 DIAGNOSIS — Z880 Allergy status to penicillin: Secondary | ICD-10-CM | POA: Diagnosis not present

## 2020-05-13 DIAGNOSIS — M9903 Segmental and somatic dysfunction of lumbar region: Secondary | ICD-10-CM | POA: Diagnosis not present

## 2020-05-13 DIAGNOSIS — O2690 Pregnancy related conditions, unspecified, unspecified trimester: Secondary | ICD-10-CM | POA: Diagnosis not present

## 2020-05-13 DIAGNOSIS — M9905 Segmental and somatic dysfunction of pelvic region: Secondary | ICD-10-CM | POA: Diagnosis not present

## 2020-05-13 DIAGNOSIS — M9904 Segmental and somatic dysfunction of sacral region: Secondary | ICD-10-CM | POA: Diagnosis not present

## 2020-05-17 DIAGNOSIS — Z3483 Encounter for supervision of other normal pregnancy, third trimester: Secondary | ICD-10-CM | POA: Diagnosis not present

## 2020-05-25 DIAGNOSIS — D259 Leiomyoma of uterus, unspecified: Secondary | ICD-10-CM | POA: Diagnosis not present

## 2020-05-25 DIAGNOSIS — Z3A37 37 weeks gestation of pregnancy: Secondary | ICD-10-CM | POA: Diagnosis not present

## 2020-05-27 ENCOUNTER — Inpatient Hospital Stay (EMERGENCY_DEPARTMENT_HOSPITAL)
Admission: AD | Admit: 2020-05-27 | Discharge: 2020-05-27 | Disposition: A | Discharge status: Home / Self Care | Payer: BC Managed Care – PPO | Source: Home / Self Care | Attending: Obstetrics & Gynecology | Admitting: Obstetrics & Gynecology

## 2020-05-27 ENCOUNTER — Encounter (HOSPITAL_COMMUNITY): Payer: Self-pay | Admitting: Obstetrics & Gynecology

## 2020-05-27 ENCOUNTER — Other Ambulatory Visit: Payer: Self-pay

## 2020-05-27 DIAGNOSIS — O3413 Maternal care for benign tumor of corpus uteri, third trimester: Secondary | ICD-10-CM | POA: Diagnosis not present

## 2020-05-27 DIAGNOSIS — O99613 Diseases of the digestive system complicating pregnancy, third trimester: Secondary | ICD-10-CM | POA: Diagnosis not present

## 2020-05-27 DIAGNOSIS — O212 Late vomiting of pregnancy: Secondary | ICD-10-CM | POA: Diagnosis not present

## 2020-05-27 DIAGNOSIS — Z20822 Contact with and (suspected) exposure to covid-19: Secondary | ICD-10-CM | POA: Diagnosis not present

## 2020-05-27 DIAGNOSIS — Z3A37 37 weeks gestation of pregnancy: Secondary | ICD-10-CM

## 2020-05-27 DIAGNOSIS — K219 Gastro-esophageal reflux disease without esophagitis: Secondary | ICD-10-CM

## 2020-05-27 DIAGNOSIS — B009 Herpesviral infection, unspecified: Secondary | ICD-10-CM | POA: Diagnosis not present

## 2020-05-27 DIAGNOSIS — D259 Leiomyoma of uterus, unspecified: Secondary | ICD-10-CM | POA: Diagnosis not present

## 2020-05-27 DIAGNOSIS — O4292 Full-term premature rupture of membranes, unspecified as to length of time between rupture and onset of labor: Secondary | ICD-10-CM | POA: Diagnosis not present

## 2020-05-27 DIAGNOSIS — O26893 Other specified pregnancy related conditions, third trimester: Secondary | ICD-10-CM | POA: Diagnosis not present

## 2020-05-27 DIAGNOSIS — A6000 Herpesviral infection of urogenital system, unspecified: Secondary | ICD-10-CM | POA: Diagnosis not present

## 2020-05-27 DIAGNOSIS — Z8616 Personal history of COVID-19: Secondary | ICD-10-CM | POA: Diagnosis not present

## 2020-05-27 DIAGNOSIS — O9081 Anemia of the puerperium: Secondary | ICD-10-CM | POA: Diagnosis not present

## 2020-05-27 DIAGNOSIS — Z3A38 38 weeks gestation of pregnancy: Secondary | ICD-10-CM | POA: Diagnosis not present

## 2020-05-27 DIAGNOSIS — O219 Vomiting of pregnancy, unspecified: Secondary | ICD-10-CM

## 2020-05-27 DIAGNOSIS — O9832 Other infections with a predominantly sexual mode of transmission complicating childbirth: Secondary | ICD-10-CM | POA: Diagnosis not present

## 2020-05-27 DIAGNOSIS — D62 Acute posthemorrhagic anemia: Secondary | ICD-10-CM | POA: Diagnosis not present

## 2020-05-27 DIAGNOSIS — H9191 Unspecified hearing loss, right ear: Secondary | ICD-10-CM | POA: Diagnosis not present

## 2020-05-27 LAB — URINALYSIS, ROUTINE W REFLEX MICROSCOPIC
Bilirubin Urine: NEGATIVE
Glucose, UA: NEGATIVE mg/dL
Hgb urine dipstick: NEGATIVE
Ketones, ur: 5 mg/dL — AB
Leukocytes,Ua: NEGATIVE
Nitrite: NEGATIVE
Protein, ur: NEGATIVE mg/dL
Specific Gravity, Urine: 1.005 (ref 1.005–1.030)
pH: 6 (ref 5.0–8.0)

## 2020-05-27 MED ORDER — FAMOTIDINE 20 MG PO TABS: 20.0000 mg | ORAL_TABLET | Freq: Two times a day (BID) | ORAL | 1 refills | Status: DC

## 2020-05-27 MED ORDER — FAMOTIDINE 20 MG PO TABS
40.0000 mg | ORAL_TABLET | Freq: Once | ORAL | Status: AC
  Administered 2020-05-27: 40 mg via ORAL
  Filled 2020-05-27: qty 2

## 2020-05-27 MED ORDER — ONDANSETRON 8 MG PO TBDP: 8.0000 mg | ORAL_TABLET | Freq: Three times a day (TID) | ORAL | 0 refills | Status: DC | PRN

## 2020-05-27 MED ORDER — ONDANSETRON 4 MG PO TBDP
8.0000 mg | ORAL_TABLET | Freq: Once | ORAL | Status: AC
  Administered 2020-05-27: 8 mg via ORAL
  Filled 2020-05-27: qty 2

## 2020-05-27 NOTE — MAU Note (Signed)
Pt reporting good FM since being on the monitor

## 2020-05-27 NOTE — MAU Provider Note (Signed)
History     CSN: [id number]  Arrival date and time: 05/27/20 1418   Event Date/Time   First Provider Initiated Contact with Patient 05/27/20 1520      Chief Complaint  Patient presents with  . Decreased Fetal Movement  . Emesis   HPI  Lynn Cardenas is a 30 y.o. female G1P0 here with N/V. The symptoms started on Monday, then improved on Tuesday and Wednesday. The vomiting returned yesterday and she reports vomiting 3x in the last 24 hours. She reports a lot of burping. She reports the N/V is worse after eating. She has tried ginger tea and peppermints which did not help.  She tried toast and a smoothie today which made her nausea.  No one around her has been sick, no sick contacts.   No pain or contractions. + fetal movement.   OB History    Gravida  1   Para      Term      Preterm      AB      Living        SAB      IAB      Ectopic      Multiple      Live Births              History reviewed. No pertinent past medical history.  History reviewed. No pertinent surgical history.  History reviewed. No pertinent family history.  Social History   Tobacco Use  . Smoking status: Never Smoker  . Smokeless tobacco: Never Used  Substance Use Topics  . Alcohol use: Never  . Drug use: Never    Allergies:  Allergies  Allergen Reactions  . Penicillins     Medications Prior to Admission  Medication Sig Dispense Refill Last Dose  . oxyCODONE-acetaminophen (PERCOCET) 5-325 MG tablet Take 2 tablets by mouth every 4 (four) hours as needed for severe pain. 20 tablet 0   . Prenatal Vit-Fe Fumarate-FA (PRENATAL MULTIVITAMIN) TABS tablet Take 1 tablet by mouth daily at 12 noon.      Results for orders placed or performed during the hospital encounter of 05/27/20 (from the past 48 hour(s))  Urinalysis, Routine w reflex microscopic Urine, Clean Catch     Status: Abnormal   Collection Time: 05/27/20  2:28 PM  Result Value Ref Range   Color, Urine YELLOW  YELLOW   APPearance CLEAR CLEAR   Specific Gravity, Urine 1.005 1.005 - 1.030   pH 6.0 5.0 - 8.0   Glucose, UA NEGATIVE NEGATIVE mg/dL   Hgb urine dipstick NEGATIVE NEGATIVE   Bilirubin Urine NEGATIVE NEGATIVE   Ketones, ur 5 (A) NEGATIVE mg/dL   Protein, ur NEGATIVE NEGATIVE mg/dL   Nitrite NEGATIVE NEGATIVE   Leukocytes,Ua NEGATIVE NEGATIVE    Comment: Performed at [facility] [address], [place], [place] [address]   Review of Systems  Constitutional: Negative for fever.  Gastrointestinal: Positive for nausea and vomiting. Negative for diarrhea.   Physical Exam   Blood pressure 116/76, pulse 96, temperature 98 F (36.7 C), temperature source Oral, resp. rate 16, height 5' (1.524 m), weight 63 kg, SpO2 95 %.  Physical Exam Vitals and nursing note reviewed.  Constitutional:      General: She is not in acute distress.    Appearance: Normal appearance. She is not ill-appearing, toxic-appearing or diaphoretic.  HENT:     Head: Normocephalic.  Abdominal:     General: There is no distension.  Palpations: Abdomen is soft.     Tenderness: There is no abdominal tenderness.  Musculoskeletal:        General: Normal range of motion.     Cervical back: Neck supple.  Skin:    General: Skin is warm.  Neurological:     Mental Status: She is alert and oriented to person, place, and time.  Psychiatric:        Behavior: Behavior normal.    Fetal Tracing: Baseline: 115/120 bpm Variability: Moderate  Accelerations: 15x15 Decelerations: None Toco: irregular pattern.   MAU Course  Procedures  None  MDM  Zofran 8 mg PO given ODT & Pepcid 40 mg PO Patient drinking oral fluids and reports she is better and now hungry.   Assessment and Plan   A:  1. Gastroesophageal reflux disease, unspecified whether esophagitis present   2. Nausea and vomiting during pregnancy   3. [redacted] weeks gestation of pregnancy     P:  Discharge home in stable condition Rx: Pepcid &  Zofran Keep OB appointment on Monday Return to MAU if symptoms worsen Avoid high acidic foods.   Lezlie Lye, NP 05/27/2020 6:23 PM

## 2020-05-27 NOTE — MAU Note (Signed)
Lynn Cardenas is a 30 y.o. at [redacted]w[redacted]d here in MAU reporting: has been vomiting for 2 days and has increased swelling in feet and legs. 3 episodes of vomiting in the past 24 hours. Having some mild cramping, feels the pain every couple of hours. Some DFM.  Onset of complaint: ongoing  Pain score: 2/10  Vitals:   05/27/20 1435  BP: 123/75  Pulse: 100  Resp: 16  Temp: 98 F (36.7 C)  SpO2: 95%     FHT:130  Lab orders placed from triage: UA

## 2020-05-27 NOTE — Discharge Instructions (Signed)
Food Choices for Gastroesophageal Reflux Disease, Adult When you have gastroesophageal reflux disease (GERD), the foods you eat and your eating habits are very important. Choosing the right foods can help ease your discomfort. Think about working with a food expert (dietitian) to help you make good choices. What are tips for following this plan? Reading food labels  Look for foods that are low in saturated fat. Foods that may help with your symptoms include: ? Foods that have less than 5% of daily value (DV) of fat. ? Foods that have 0 grams of trans fat. Cooking  Do not fry your food.  Cook your food by baking, steaming, grilling, or broiling. These are all methods that do not need a lot of fat for cooking.  To add flavor, try to use herbs that are low in spice and acidity. Meal planning  Choose healthy foods that are low in fat, such as: ? Fruits and vegetables. ? Whole grains. ? Low-fat dairy products. ? Lean meats, fish, and poultry.  Eat small meals often instead of eating 3 large meals each day. Eat your meals slowly in a place where you are relaxed. Avoid bending over or lying down until 2-3 hours after eating.  Limit high-fat foods such as fatty meats or fried foods.  Limit your intake of fatty foods, such as oils, butter, and shortening.  Avoid the following as told by your doctor: ? Foods that cause symptoms. These may be different for different people. Keep a food diary to keep track of foods that cause symptoms. ? Alcohol. ? Drinking a lot of liquid with meals. ? Eating meals during the 2-3 hours before bed.   Lifestyle  Stay at a healthy weight. Ask your doctor what weight is healthy for you. If you need to lose weight, work with your doctor to do so safely.  Exercise for at least 30 minutes on 5 or more days each week, or as told by your doctor.  Wear loose-fitting clothes.  Do not smoke or use any products that contain nicotine or tobacco. If you need help  quitting, ask your doctor.  Sleep with the head of your bed higher than your feet. Use a wedge under the mattress or blocks under the bed frame to raise the head of the bed.  Chew sugar-free gum after meals. What foods should eat? Eat a healthy, well-balanced diet of fruits, vegetables, whole grains, low-fat dairy products, lean meats, fish, and poultry. Each person is different. Foods that may cause symptoms in one person may not cause any symptoms in another person. Work with your doctor to find foods that are safe for you. The items listed above may not be a complete list of what you can eat and drink. Contact a food expert for more options.   What foods should I avoid? Limiting some of these foods may help in managing the symptoms of GERD. Everyone is different. Talk with a food expert or your doctor to help you find the exact foods to avoid, if any. Fruits Any fruits prepared with added fat. Any fruits that cause symptoms. For some people, this may include citrus fruits, such as oranges, grapefruit, pineapple, and lemons. Vegetables Deep-fried vegetables. French fries. Any vegetables prepared with added fat. Any vegetables that cause symptoms. For some people, this may include tomatoes and tomato products, chili peppers, onions and garlic, and horseradish. Grains Pastries or quick breads with added fat. Meats and other proteins High-fat meats, such as fatty beef or pork,   hot dogs, ribs, ham, sausage, salami, and bacon. Fried meat or protein, including fried fish and fried chicken. Nuts and nut butters, in large amounts. Dairy Whole milk and chocolate milk. Sour cream. Cream. Ice cream. Cream cheese. Milkshakes. Fats and oils Butter. Margarine. Shortening. Ghee. Beverages Coffee and tea, with or without caffeine. Carbonated beverages. Sodas. Energy drinks. Fruit juice made with acidic fruits, such as orange or grapefruit. Tomato juice. Alcoholic drinks. Sweets and desserts Chocolate and  cocoa. Donuts. Seasonings and condiments Pepper. Peppermint and spearmint. Added salt. Any condiments, herbs, or seasonings that cause symptoms. For some people, this may include curry, hot sauce, or vinegar-based salad dressings. The items listed above may not be a complete list of what you should not eat and drink. Contact a food expert for more options. Questions to ask your doctor Diet and lifestyle changes are often the first steps that are taken to manage symptoms of GERD. If diet and lifestyle changes do not help, talk with your doctor about taking medicines. Where to find more information  International Foundation for Gastrointestinal Disorders: aboutgerd.org Summary  When you have GERD, food and lifestyle choices are very important in easing your symptoms.  Eat small meals often instead of 3 large meals a day. Eat your meals slowly and in a place where you are relaxed.  Avoid bending over or lying down until 2-3 hours after eating.  Limit high-fat foods such as fatty meats or fried foods. This information is not intended to replace advice given to you by your health care provider. Make sure you discuss any questions you have with your health care provider. Document Revised: 07/06/2019 Document Reviewed: 07/06/2019 Elsevier Patient Education  2021 Elsevier Inc.  

## 2020-05-27 NOTE — MAU Note (Signed)
Pt able to tolerate PO pepcid and water. Reports good FM. Denies pain.

## 2020-05-30 ENCOUNTER — Encounter (HOSPITAL_COMMUNITY): Payer: Self-pay | Admitting: Obstetrics and Gynecology

## 2020-05-30 ENCOUNTER — Other Ambulatory Visit: Payer: Self-pay

## 2020-05-30 ENCOUNTER — Inpatient Hospital Stay (HOSPITAL_COMMUNITY)
Admission: AD | Admit: 2020-05-30 | Discharge: 2020-06-01 | DRG: 806 | Disposition: A | Discharge status: Home / Self Care | Payer: BC Managed Care – PPO | Attending: Obstetrics & Gynecology | Admitting: Obstetrics & Gynecology

## 2020-05-30 DIAGNOSIS — Z3A38 38 weeks gestation of pregnancy: Secondary | ICD-10-CM

## 2020-05-30 DIAGNOSIS — A6000 Herpesviral infection of urogenital system, unspecified: Secondary | ICD-10-CM | POA: Diagnosis present

## 2020-05-30 DIAGNOSIS — Z8616 Personal history of COVID-19: Secondary | ICD-10-CM | POA: Diagnosis not present

## 2020-05-30 DIAGNOSIS — O4292 Full-term premature rupture of membranes, unspecified as to length of time between rupture and onset of labor: Principal | ICD-10-CM | POA: Diagnosis present

## 2020-05-30 DIAGNOSIS — O9832 Other infections with a predominantly sexual mode of transmission complicating childbirth: Secondary | ICD-10-CM | POA: Diagnosis present

## 2020-05-30 DIAGNOSIS — D259 Leiomyoma of uterus, unspecified: Secondary | ICD-10-CM | POA: Diagnosis present

## 2020-05-30 DIAGNOSIS — H9191 Unspecified hearing loss, right ear: Secondary | ICD-10-CM | POA: Diagnosis present

## 2020-05-30 DIAGNOSIS — D62 Acute posthemorrhagic anemia: Secondary | ICD-10-CM | POA: Diagnosis not present

## 2020-05-30 DIAGNOSIS — B009 Herpesviral infection, unspecified: Secondary | ICD-10-CM | POA: Diagnosis not present

## 2020-05-30 DIAGNOSIS — O9081 Anemia of the puerperium: Secondary | ICD-10-CM | POA: Diagnosis not present

## 2020-05-30 DIAGNOSIS — O3413 Maternal care for benign tumor of corpus uteri, third trimester: Secondary | ICD-10-CM | POA: Diagnosis present

## 2020-05-30 DIAGNOSIS — Z20822 Contact with and (suspected) exposure to covid-19: Secondary | ICD-10-CM | POA: Diagnosis present

## 2020-05-30 DIAGNOSIS — O4290 Premature rupture of membranes, unspecified as to length of time between rupture and onset of labor, unspecified weeks of gestation: Secondary | ICD-10-CM | POA: Diagnosis present

## 2020-05-30 DIAGNOSIS — O99019 Anemia complicating pregnancy, unspecified trimester: Secondary | ICD-10-CM | POA: Diagnosis not present

## 2020-05-30 DIAGNOSIS — O3410 Maternal care for benign tumor of corpus uteri, unspecified trimester: Secondary | ICD-10-CM | POA: Diagnosis present

## 2020-05-30 DIAGNOSIS — O26893 Other specified pregnancy related conditions, third trimester: Secondary | ICD-10-CM | POA: Diagnosis present

## 2020-05-30 HISTORY — DX: Benign neoplasm of connective and other soft tissue, unspecified: D21.9

## 2020-05-30 HISTORY — DX: Herpesviral infection of other urogenital tract: A60.09

## 2020-05-30 LAB — RESP PANEL BY RT-PCR (FLU A&B, COVID) ARPGX2
Influenza A by PCR: NEGATIVE
Influenza B by PCR: NEGATIVE
SARS Coronavirus 2 by RT PCR: NEGATIVE

## 2020-05-30 LAB — CBC
HCT: 34 % — ABNORMAL LOW (ref 36.0–46.0)
Hemoglobin: 11 g/dL — ABNORMAL LOW (ref 12.0–15.0)
MCH: 30.8 pg (ref 26.0–34.0)
MCHC: 32.4 g/dL (ref 30.0–36.0)
MCV: 95.2 fL (ref 80.0–100.0)
Platelets: 342 10*3/uL (ref 150–400)
RBC: 3.57 MIL/uL — ABNORMAL LOW (ref 3.87–5.11)
RDW: 13.2 % (ref 11.5–15.5)
WBC: 4.2 10*3/uL (ref 4.0–10.5)
nRBC: 0 % (ref 0.0–0.2)

## 2020-05-30 LAB — TYPE AND SCREEN
ABO/RH(D): O POS
Antibody Screen: NEGATIVE

## 2020-05-30 MED ORDER — BENZOCAINE-MENTHOL 20-0.5 % EX AERO
1.0000 "application " | INHALATION_SPRAY | CUTANEOUS | Status: DC | PRN
  Administered 2020-05-31: 1 via TOPICAL
  Filled 2020-05-30: qty 56

## 2020-05-30 MED ORDER — OXYCODONE-ACETAMINOPHEN 5-325 MG PO TABS
1.0000 | ORAL_TABLET | ORAL | Status: DC | PRN
Start: 2020-05-30 — End: 2020-05-30

## 2020-05-30 MED ORDER — DIBUCAINE (PERIANAL) 1 % EX OINT
1.0000 | TOPICAL_OINTMENT | CUTANEOUS | Status: DC | PRN
Start: 2020-05-30 — End: 2020-06-01
  Administered 2020-06-01: 1 via RECTAL
  Filled 2020-05-30: qty 28

## 2020-05-30 MED ORDER — PRENATAL MULTIVITAMIN CH
1.0000 | ORAL_TABLET | Freq: Every day | ORAL | Status: DC
  Administered 2020-05-31: 1 via ORAL
  Filled 2020-05-30: qty 1

## 2020-05-30 MED ORDER — ONDANSETRON HCL 4 MG/2ML IJ SOLN: 4.0000 mg | INTRAMUSCULAR | Status: DC | PRN

## 2020-05-30 MED ORDER — ACETAMINOPHEN 325 MG PO TABS: 650.0000 mg | ORAL_TABLET | ORAL | Status: DC | PRN

## 2020-05-30 MED ORDER — WITCH HAZEL-GLYCERIN EX PADS
1.0000 "application " | MEDICATED_PAD | CUTANEOUS | Status: DC | PRN
  Administered 2020-06-01: 1 via TOPICAL

## 2020-05-30 MED ORDER — ONDANSETRON HCL 4 MG/2ML IJ SOLN: 4.0000 mg | Freq: Four times a day (QID) | INTRAMUSCULAR | Status: DC | PRN

## 2020-05-30 MED ORDER — ONDANSETRON HCL 4 MG PO TABS: 4.0000 mg | ORAL_TABLET | ORAL | Status: DC | PRN

## 2020-05-30 MED ORDER — OXYTOCIN BOLUS FROM INFUSION: 333.0000 mL | Freq: Once | INTRAVENOUS | Status: DC

## 2020-05-30 MED ORDER — LACTATED RINGERS IV SOLN: INTRAVENOUS | Status: DC

## 2020-05-30 MED ORDER — FENTANYL CITRATE (PF) 100 MCG/2ML IJ SOLN: 50.0000 ug | INTRAMUSCULAR | Status: DC | PRN

## 2020-05-30 MED ORDER — ZOLPIDEM TARTRATE 5 MG PO TABS: 5.0000 mg | ORAL_TABLET | Freq: Every evening | ORAL | Status: DC | PRN

## 2020-05-30 MED ORDER — SOD CITRATE-CITRIC ACID 500-334 MG/5ML PO SOLN: 30.0000 mL | ORAL | Status: DC | PRN

## 2020-05-30 MED ORDER — COCONUT OIL OIL: 1.0000 "application " | TOPICAL_OIL | Status: DC | PRN

## 2020-05-30 MED ORDER — OXYTOCIN 10 UNIT/ML IJ SOLN
INTRAMUSCULAR | Status: AC
  Administered 2020-05-30: 10 [IU] via INTRAMUSCULAR
  Filled 2020-05-30: qty 1

## 2020-05-30 MED ORDER — SENNOSIDES-DOCUSATE SODIUM 8.6-50 MG PO TABS
2.0000 | ORAL_TABLET | ORAL | Status: DC
  Administered 2020-06-01: 2 via ORAL
  Filled 2020-05-30 (×2): qty 2

## 2020-05-30 MED ORDER — LACTATED RINGERS IV SOLN: 500.0000 mL | INTRAVENOUS | Status: DC | PRN

## 2020-05-30 MED ORDER — TETANUS-DIPHTH-ACELL PERTUSSIS 5-2.5-18.5 LF-MCG/0.5 IM SUSY: 0.5000 mL | PREFILLED_SYRINGE | Freq: Once | INTRAMUSCULAR | Status: DC

## 2020-05-30 MED ORDER — OXYTOCIN 10 UNIT/ML IJ SOLN: 10.0000 [IU] | Freq: Once | INTRAMUSCULAR | Status: AC

## 2020-05-30 MED ORDER — SIMETHICONE 80 MG PO CHEW: 80.0000 mg | CHEWABLE_TABLET | ORAL | Status: DC | PRN

## 2020-05-30 MED ORDER — OXYCODONE-ACETAMINOPHEN 5-325 MG PO TABS: 2.0000 | ORAL_TABLET | ORAL | Status: DC | PRN

## 2020-05-30 MED ORDER — LIDOCAINE HCL (PF) 1 % IJ SOLN
30.0000 mL | INTRAMUSCULAR | Status: AC | PRN
  Administered 2020-05-30: 30 mL via SUBCUTANEOUS
  Filled 2020-05-30 (×2): qty 30

## 2020-05-30 MED ORDER — IBUPROFEN 600 MG PO TABS
600.0000 mg | ORAL_TABLET | Freq: Four times a day (QID) | ORAL | Status: DC
  Administered 2020-05-31 – 2020-06-01 (×4): 600 mg via ORAL
  Filled 2020-05-30 (×5): qty 1

## 2020-05-30 MED ORDER — DIPHENHYDRAMINE HCL 25 MG PO CAPS: 25.0000 mg | ORAL_CAPSULE | Freq: Four times a day (QID) | ORAL | Status: DC | PRN

## 2020-05-30 MED ORDER — OXYTOCIN-SODIUM CHLORIDE 30-0.9 UT/500ML-% IV SOLN
2.5000 [IU]/h | INTRAVENOUS | Status: DC
  Filled 2020-05-30: qty 500

## 2020-05-30 NOTE — MAU Note (Signed)
Pt reports her water broke at 11:35am. Having ctx on and of and they are getting stronger. Good fetal movement felt.

## 2020-05-30 NOTE — H&P (Addendum)
OB ADMISSION/ HISTORY & PHYSICAL:  Admission Date: 05/30/2020  1:10 PM  Admit Diagnosis: Normal labor  Lynn Cardenas is a 30 y.o. female G1P0 [redacted]w[redacted]d presenting for LOF since 1130. Endorses active FM, denies vaginal bleeding. Mild ctx began after SROM and have increased in frequency, but are mild to moderate in intensity. Pt desires minimal interventions and requesting to go home and wait for labor. Admission for labor recommended D/T to SROM, discussed plan for minimal intervention and expectant management and pt agrees to be admitted. Anterior uterine fibroid.   History of current pregnancy: G1P0   Patient entered care with [facility] at 11+2 wks.   EDC by LMP and congruent w/ 7+4 wk U/S.  Anatomy U/S complete w/ anterior placenta   Patient Active Problem List   Diagnosis Date Noted  . 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 Normal labor 05/30/2020  . 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 Fibroid   . 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 NO PAST SURGERIES     Family History: No family history on file.  Social History:  reports that she has never smoked. She has never used smokeless tobacco. She reports that she does not drink alcohol and does not use drugs.  Allergies: Penicillins   Current Medications at time of admission:  Prior to  Admission medications   Medication Sig Start Date End Date Taking? Authorizing Provider  famotidine (PEPCID) 20 MG tablet Take 1 tablet (20 mg total) by mouth 2 (two) times daily for 365 doses. 05/27/20 11/26/20 Yes Rasch, Artist Pais, NP  ondansetron (ZOFRAN ODT) 8 MG disintegrating tablet Take 1 tablet (8 mg total) by mouth every 8 (eight) hours as needed for nausea or vomiting. 05/27/20  Yes Rasch, Artist Pais, NP  Prenatal Vit-Fe Fumarate-FA (PRENATAL MULTIVITAMIN) TABS tablet Take 1 tablet by mouth daily at 12 noon.    [provider]    Review of Systems: Constitutional: Negative   HENT: Negative   Eyes: Negative   Respiratory: Negative   Cardiovascular: Negative   Gastrointestinal: Negative  Genitourinary: neg for bloody show, pos for LOF   Musculoskeletal: Negative   Skin: Negative   Neurological: Negative   Endo/Heme/Allergies: Negative   Psychiatric/Behavioral: Negative    Physical Exam: VS: Blood pressure 137/89, pulse (!) 101, temperature 97.9 F (36.6 C), resp. rate 18. AAO x3, no signs of distress Cardiovascular: RRR Respiratory: Lung fields clear to ausculation GU/GI: Abdomen gravid, non-tender, non-distended, active FM, vertex, EFW 6# per Leopold's Extremities: 1+ pedal edema, negative for pain, tenderness, and cords  Cervical exam:Dilation: 1 Effacement (%): 90 Station:  0 Exam by:: K.Wilson<RN FHR: baseline rate 140 / variability moderate / accelerations present / absent decelerations TOCO: 2-3 min   Prenatal Transfer Tool  Maternal Diabetes: No Genetic Screening: Declined Maternal Ultrasounds/Referrals: Normal Fetal Ultrasounds or other Referrals:  None Maternal Substance Abuse:  No Significant Maternal Medications:  Meds include: Other: Valtrex since 34 wks Significant Maternal Lab Results: Group B Strep negative    Assessment: 30 y.o. G1P0 [redacted]w[redacted]d SROM HSV    -on suppression since 34 wks, negative spec exam, denies prodromal  symptoms  Anterior uterine fibroid Latent stage of labor FHR category 1 GBS neg 5/10 Pain management plan: unmedicated   Plan:  Admit to L&D Routine admission orders Epidural PRN  Dr Charlesetta Garibaldi notified of admission and plan of care  Arrie Eastern MSN, CNM 05/30/2020 2:07 PM

## 2020-05-30 NOTE — Progress Notes (Signed)
Subjective:    Doula and S/O present and supportive. Pt declined SVE stating that it is "painful", despite being provided a rationale for the exam. Pt is breathing and moaning w/ each ctx and using freedom of movement for pain management.   Objective:    VS: BP 136/79   Pulse 98   Temp 98.2 F (36.8 C) (Axillary)   Resp 18   Ht 5' (1.524 m)   Wt 62.6 kg   BMI 26.95 kg/m  FHR : baseline 120 / variability moderate / accelerations present / absent decelerations Toco: contractions every 2 minutes  Membranes: SROM x 8 hrs, remains clear Dilation: 1 Effacement (%): 90 Station: 0 Presentation: Vertex Exam by:: K.Wilson<RN   Assessment/Plan:   30 y.o. G1P0 [redacted]w[redacted]d  Labor: appears to be progressing normally Fetal Wellbeing:  Category I Pain Control:  ambulation, massage, breathing I/D:  GBS neg Anticipated MOD:  NSVD  Arrie Eastern MSN, CNM 05/30/2020 7:56 PM

## 2020-05-30 NOTE — Lactation Note (Signed)
This note was copied from a baby's chart. Lactation Consultation Note  Patient Name: Lynn Cardenas [id number] Date: 05/30/2020 Reason for consult: L&D Initial assessment;Early term 37-38.6wks Age:28 hours  L&D consult with 49 minutes old infant and P1 mother. Parents and doula  Are present at time of consult. Congratulated them on their newborn. Infant is skin to skin prone on mother's chest. Discussed STS as ideal transition for infants after birth helping with temperature, blood sugar and comfort. Talked about primal reflexes such as rooting, hands to mouth, searching for the breast among others.   Several attempts to latch, infant licks nipple but no latch. Explained [place] services availability during postpartum stay. Thanked family for their time.    Maternal Data Has patient been taught Hand Expression?: Yes Does the patient have breastfeeding experience prior to this delivery?: No  Feeding Mother's Current Feeding Choice: Breast Milk  LATCH Score Latch: Repeated attempts needed to sustain latch, nipple held in mouth throughout feeding, stimulation needed to elicit sucking reflex.  Audible Swallowing: None  Type of Nipple: Everted at rest and after stimulation (short shafted)  Comfort (Breast/Nipple): Soft / non-tender  Hold (Positioning): Assistance needed to correctly position infant at breast and maintain latch.  LATCH Score: 6  Interventions Interventions: Assisted with latch;Skin to skin;Breast massage;Hand express;Education;Expressed milk  Consult Status Consult Status: Follow-up Date: 05/30/20 Follow-up type: In-patient    Latarra Eagleton A Higuera Ancidey 05/30/2020, 9:24 PM

## 2020-05-30 NOTE — Plan of Care (Signed)

## 2020-05-31 LAB — CBC
HCT: 27.6 % — ABNORMAL LOW (ref 36.0–46.0)
Hemoglobin: 9.5 g/dL — ABNORMAL LOW (ref 12.0–15.0)
MCH: 30.8 pg (ref 26.0–34.0)
MCHC: 34.4 g/dL (ref 30.0–36.0)
MCV: 89.6 fL (ref 80.0–100.0)
Platelets: 293 10*3/uL (ref 150–400)
RBC: 3.08 MIL/uL — ABNORMAL LOW (ref 3.87–5.11)
RDW: 13.2 % (ref 11.5–15.5)
WBC: 13.9 10*3/uL — ABNORMAL HIGH (ref 4.0–10.5)
nRBC: 0 % (ref 0.0–0.2)

## 2020-05-31 LAB — RPR: RPR Ser Ql: NONREACTIVE

## 2020-05-31 MED ORDER — MENTHOL 3 MG MT LOZG
1.0000 | LOZENGE | OROMUCOSAL | Status: DC | PRN
  Administered 2020-05-31: 3 mg via ORAL
  Filled 2020-05-31: qty 9

## 2020-05-31 NOTE — Lactation Note (Signed)
This note was copied from a baby's chart. Lactation Consultation Note  Patient Name: Lynn Cardenas Date: 05/31/2020 Reason for consult: Follow-up assessment;Mother's request;Difficult latch;Early term 37-38.6wks;Infant < 6lbs;Primapara;1st time breastfeeding Age:28 hours Mom stated offered DBM only once infant appeared sluggish at the breast earlier in the day. Since then Mom states infant improved with longer feedings at the breast. LC talked with Mom about offering her EBM or DBM as a supplement after latching. Mom states she wants to try to see what volume she gets from pumping and offer her EBM via spoon or curve tip with finger feeding.   LC reviewed how to reduce calorie loss for LPTI including keeping total feeding under 30 minutes.   Plan 1. To feed based on cues 8-12x in 24hr period no more than 3 hrs without an attempt. Mom to offer both breasts with signs of milk transfer with breast compression.          2. Mom to supplement her eBM based on LPTI infant guidelines reviewed via spoon or finger feeding.           3. Mom to pump on DEBP q3 hrs for 49minutes.   Mom to call after she completes pumping to work on offering her EBM as a supplement. Mom declined use of artificial nipple at this time for supplementation.  Maternal Data Has patient been taught Hand Expression?: Yes  Feeding Mother's Current Feeding Choice: Breast Milk and Donor Milk  LATCH Score Latch: Repeated attempts needed to sustain latch, nipple held in mouth throughout feeding, stimulation needed to elicit sucking reflex.  Audible Swallowing: Spontaneous and intermittent  Type of Nipple: Flat  Comfort (Breast/Nipple): Soft / non-tender  Hold (Positioning): Assistance needed to correctly position infant at breast and maintain latch.  LATCH Score: 7   Lactation Tools Discussed/Used Tools: Pump;Flanges;Shells Flange Size: 24 Breast pump type: Double-Electric Breast Pump Pump Education:  Setup, frequency, and cleaning;Milk Storage Reason for Pumping: increase stimulation Pumping frequency: every 3 hrs for 15 minutes  Interventions Interventions: Breast feeding basics reviewed;Breast compression;Assisted with latch;Adjust position;Skin to skin;Support pillows;Breast massage;Hand express;Expressed milk;Education;Shells;DEBP  Discharge Pump: Personal  Consult Status Consult Status: Follow-up Date: 06/01/20 Follow-up type: In-patient    Terez Freimark  N[facility] 05/31/2020, 8:03 PM

## 2020-05-31 NOTE — Progress Notes (Signed)
PPD# 1SVD w/ 1st degree perineal Information for the patient's newborn:  Lynn Cardenas, Lynn Cardenas [[id number]]  female    S:   Reports feeling "really good, a little sore" Tolerating PO fluid and solids No nausea or vomiting Bleeding is light Pain controlled with acetaminophen and ibuprofen (OTC) Up ad lib / ambulatory / voiding w/o difficulty Feeding: Breast    O:   VS: BP 119/82 (BP Location: Right Arm)   Pulse 98   Temp 98 F (36.7 C) (Oral)   Resp 18   Ht 5' (1.524 m)   Wt 62.6 kg   SpO2 100%   Breastfeeding Unknown   BMI 26.95 kg/m   LABS:  Recent Labs    05/30/20 1618 05/31/20 0528  WBC 4.2 13.9*  HGB 11.0* 9.5*  PLT 342 293   Blood type: --/--/O POS (05/23 1618) Rubella: Immune (10/19 0000)                      I&O: Intake/Output      05/23 0701 05/24 0700 05/24 0701 05/25 0700   Urine (mL/kg/hr) 0    Blood 250    Total Output 250    Net -250         Urine Occurrence 1 x      Physical Exam: Alert and oriented X3 Lungs: Clear and unlabored Heart: regular rate and rhythm / no mumurs Abdomen: soft, non-tender, non-distended  Fundus: firm, non-tender Perineum: well approximated Lochia: minimal Extremities: generalized edema, no calf pain or tenderness    A:  PPD # 1  Normal exam  P:  Routine post partum orders  Anticipate D/C on 06/01/20   Plan reviewed w/ Dr. Modena Jansky, MSN, CNM 05/31/2020, 9:42 AM

## 2020-05-31 NOTE — Lactation Note (Signed)
This note was copied from a baby's chart. Lactation Consultation Note Attempted to see mom, mom sleeping. Showed FOB badge for Lactation. FOB came to the door and told LC when baby last fed.  Patient Name: Girl Tilda Samudio BBCWU'G Date: 05/31/2020   Age:28 hours  Maternal Data    Feeding    LATCH Score Latch: Repeated attempts needed to sustain latch, nipple held in mouth throughout feeding, stimulation needed to elicit sucking reflex.  Audible Swallowing: A few with stimulation  Type of Nipple: Everted at rest and after stimulation (short shafted)  Comfort (Breast/Nipple): Soft / non-tender  Hold (Positioning): Assistance needed to correctly position infant at breast and maintain latch.  LATCH Score: 7   Lactation Tools Discussed/Used    Interventions Interventions: Assisted with latch;Skin to skin;Hand pump;Support pillows;Adjust position  Discharge    Consult Status      Theodoro Kalata 05/31/2020, 3:35 AM

## 2020-06-01 DIAGNOSIS — D62 Acute posthemorrhagic anemia: Secondary | ICD-10-CM | POA: Diagnosis not present

## 2020-06-01 DIAGNOSIS — O4290 Premature rupture of membranes, unspecified as to length of time between rupture and onset of labor, unspecified weeks of gestation: Secondary | ICD-10-CM | POA: Diagnosis present

## 2020-06-01 MED ORDER — POLYSACCHARIDE IRON COMPLEX 150 MG PO CAPS: 150.0000 mg | ORAL_CAPSULE | Freq: Every day | ORAL | 2 refills | Status: DC

## 2020-06-01 MED ORDER — POLYSACCHARIDE IRON COMPLEX 150 MG PO CAPS
150.0000 mg | ORAL_CAPSULE | Freq: Every day | ORAL | Status: DC
  Administered 2020-06-01: 150 mg via ORAL
  Filled 2020-06-01: qty 1

## 2020-06-01 MED ORDER — IBUPROFEN 600 MG PO TABS: 600.0000 mg | ORAL_TABLET | Freq: Four times a day (QID) | ORAL | 0 refills | Status: DC

## 2020-06-01 NOTE — Lactation Note (Signed)
This note was copied from a baby's chart. Lactation Consultation Note  Patient Name: Lynn Cardenas OACZY'S Date: 06/01/2020 Reason for consult: Follow-up assessment Age:28 hours   Assist mother with hand expression. Assist mother in obtaining 5 ml of ebm. Encouraged mother to massage and hand express.  Mother encouraged to pump every 3 hours for 15-20 mins  Consistently to protect her milk supply.  Mother receptive to all teaching.   Mother to page when infant is ready for next feeding.    Maternal Data    Feeding Mother's Current Feeding Choice: Breast Milk and Donor Milk Nipple Type: (P) Extra Slow Flow  LATCH Score                    Lactation Tools Discussed/Used    Interventions    Discharge    Consult Status Consult Status: Follow-up Date: 06/01/20 Follow-up type: In-patient    Jess Barters [facility] 06/01/2020, 3:29 PM

## 2020-06-01 NOTE — Discharge Summary (Signed)
SVD OB Discharge Summary     Patient Name: Lynn Cardenas DOB: [birth year] MRN: [id number]  Date of admission: 05/30/2020 Delivering MD: Burman Foster B  Date of delivery: 05/30/2020 Type of delivery: SVD  Newborn Data: Sex: Baby female Live born female  Birth Weight: 5 lb 5.5 oz (2424 g) APGAR: 76, 9  Newborn Delivery   Birth date/time: 05/30/2020 20:29:00 Delivery type: Vaginal, Spontaneous      Feeding: breast Infant being discharge to home with mother in stable condition.   Admitting diagnosis: Normal labor [O80, Z37.9] Intrauterine pregnancy: [redacted]w[redacted]d     Secondary diagnosis:  Active Problems:   Normal labor   HSV infection   Anemia of pregnancy   Uterine fibroids affecting pregnancy   SVD (5/23)   Acute blood loss anemia   PROM (premature rupture of membranes)   Normal postpartum course                                Complications: None                                                              Intrapartum Procedures: spontaneous vaginal delivery Postpartum Procedures: none Complications-Operative and Postpartum: 1st degree perineal laceration Augmentation: N/A   History of Present Illness: Lynn Cardenas is a 30 y.o. female, G1P1001, who presents at [redacted]w[redacted]d weeks gestation. The patient has been followed at  [facility]  Her pregnancy has been complicated by:  Patient Active Problem List   Diagnosis Date Noted  . Acute blood loss anemia 06/01/2020  . PROM (premature rupture of membranes) 06/01/2020  . Normal postpartum course 06/01/2020  . SVD (5/23) 05/31/2020  . 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 Normal labor 05/30/2020  . 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 Anemia of pregnancy 05/30/2020  . Uterine fibroids affecting pregnancy 05/30/2020     Active Ambulatory Problems    Diagnosis Date Noted  . 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 No Resolved  Ambulatory Problems   Past Medical History:  Diagnosis Date  . Fibroid   . Herpes genitalis in women      Hospital course:  Onset of Labor With Vaginal Delivery      30 y.o. yo G1P1001 at [redacted]w[redacted]d was admitted in Latent Labor on 05/30/2020. Patient had an uncomplicated labor course as follows:  Membrane Rupture Time/Date: 11:35 AM ,05/30/2020   Delivery Method:Vaginal, Spontaneous  Episiotomy: None  Lacerations:  1st degree  Patient had an uncomplicated postpartum course.  She is ambulating, tolerating a regular diet, passing flatus, and urinating well. Patient is discharged home in stable condition on 06/01/20.  Newborn Data: Birth date:05/30/2020  Birth time:8:29 PM  Gender:Female  Living status:Living  Apgars:8 ,9  [id number] g  Postpartum Day # 2 : S/P NSVD due to pt was admitted on 5/23 for PROM, clear, progressed natural with SVD on 05/30/2020 @ 2029 over first degree laceration ebl pf 237mls, with hgb drop of 11-9.5 on PO iron asymptomatic. Pt has h/ o parital deafness on right ear, fibroids, hsv+ no lesions.  Hade baby female.. Patient up ad lib, denies syncope or dizziness.  Reports consuming regular diet without issues and denies N/V. Patient reports 0 bowel movement + passing flatus.  Denies issues with urination and reports bleeding is "lighter."  Patient is breastfeeding and reports going well.  Desires undecided for postpartum contraception.  Pain is being appropriately managed with use of po meds.   Physical exam  Vitals:   05/31/20 0810 05/31/20 1300 05/31/20 2100 06/01/20 0520  BP: 119/82 107/72 112/82 115/81  Pulse: 98 86 84 89  Resp: 18 17 16 17   Temp: 98 F (36.7 C) 98.1 F (36.7 C) 97.7 F (36.5 C) 97.7 F (36.5 C)  TempSrc: Oral Oral Oral Oral  SpO2:   100% 100%  Weight:      Height:       General: alert, cooperative and no distress Lochia: appropriate Uterine Fundus: firm Incision: Healing well with no significant drainage, No significant erythema, Dressing  is clean, dry, and intact, honeycomb dressing CDI Perineum: approximate, no hematomas DVT Evaluation: No evidence of DVT seen on physical exam. Negative Homan's sign. No cords or calf tenderness. No significant calf/ankle edema.  Labs: Lab Results  Component Value Date   WBC 13.9 (H) 05/31/2020   HGB 9.5 (L) 05/31/2020   HCT 27.6 (L) 05/31/2020   MCV 89.6 05/31/2020   PLT 293 05/31/2020   No flowsheet data found.  Date of discharge: 06/01/2020 Discharge Diagnoses: Term Pregnancy-delivered Discharge instruction: per After Visit Summary and "Baby and Me Booklet".  After visit meds:   Activity:           unrestricted and pelvic rest Advance as tolerated. Pelvic rest for 6 weeks.  Diet:                routine Medications: PNV, Ibuprofen, Colace and Iron Postpartum contraception: Undecided Condition:  Pt discharge to home with baby in stable and condition Anemia: PO Iron.   Meds: Allergies as of 06/01/2020      Reactions   Penicillins       Medication List    STOP taking these medications   ondansetron 8 MG disintegrating tablet Commonly known as: Zofran ODT     TAKE these medications   famotidine 20 MG tablet Commonly known as: PEPCID Take 1 tablet (20 mg total) by mouth 2 (two) times daily for 365 doses.   ibuprofen 600 MG tablet Commonly known as: ADVIL Take 1 tablet (600 mg total) by mouth every 6 (six) hours.   iron polysaccharides 150 MG capsule Commonly known as: NIFEREX Take 1 capsule (150 mg total) by mouth daily.   prenatal multivitamin Tabs tablet Take 1 tablet by mouth daily at 12 noon.       Discharge Follow Up:   Follow-up [facility]. Schedule an appointment as soon as possible for a visit in 6 week(s).   Specialty: Obstetrics and Gynecology Contact information: [address] [place] [address] [place], NP-C,  CNM 06/01/2020, 10:07 AM  Noralyn Pick, [place]

## 2020-06-01 NOTE — Lactation Note (Signed)
This note was copied from a baby's chart. Lactation Consultation Note  Patient Name: Girl Nohemi Nicklaus LSLHT'D Date: 06/01/2020 Reason for consult: Follow-up assessment;Infant < 6lbs;Early term 37-38.6wks;Infant weight loss Age:28 hours  Infant latching  When LC entered room.  Mom had infant in football on right side.  LC helped adjust positioning and provide more pillows for mom and infant head/support.    Infant's top lip was tucked; LC was able to flange lip and mom felt more comfort.  Infant does flange bottom lip.  Good rhythmic sucking and some swallows were heard.  Mom felt infant was feeding well and actively eating compared to previous feeds.  Infant fed in cross cradle on the other breast.  Mom need assistance latching.  LC reviewed importance of good head and neck support and bringing into to the breast.  Infant latched well and feed for 15 more minutes.    LC attempted to spoon feed infant but infant was tongue thrusting.  During oral assessment, infant tongue thrusted gloved finger and when she did begin to suck, only had the tip of the finger in her mouth.  High palate noted.   Infant finger fed 77ml colostrum.  DBM placed into bottle and infant did take 6 ml but milk leaked from the corners of the mouth.  Infant refused any more.  Mom stated milk leaked when infant fed from the bottle earlier. RN notified and mom was encouraged to call out if infant continues to have difficulty with bottle feeding.    Mom will continue to breastfeed, hand express, and supplement after feedings; dad will plan to bottle feed. Guidelines for supplementation reviewed.   Maternal Data    Feeding Mother's Current Feeding Choice: Breast Milk and Donor Milk  LATCH Score Latch: Grasps breast easily, tongue down, lips flanged, rhythmical sucking.  Audible Swallowing: A few with stimulation  Type of Nipple: Everted at rest and after stimulation  Comfort (Breast/Nipple): Filling, red/small blisters or  bruises, mild/mod discomfort  Hold (Positioning): Assistance needed to correctly position infant at breast and maintain latch.  LATCH Score: 7   Lactation Tools Discussed/Used Tools: Pump Breast pump type: Double-Electric Breast Pump Reason for Pumping: stimulate milk supply/ collect for supplementation Pumped volume: 4 mL (Hand expression)  Interventions Interventions: Breast feeding basics reviewed;Assisted with latch;Skin to skin;Breast massage;Position options;Support pillows;Adjust position;Breast compression;Expressed milk;Education  Discharge Pump: Manual;DEBP  Consult Status Consult Status: Follow-up Date: 06/02/20 Follow-up type: In-patient    Ferne Coe [facility] 06/01/2020, 5:35 PM

## 2020-06-02 ENCOUNTER — Ambulatory Visit: Payer: Self-pay

## 2020-06-02 LAB — SURGICAL PATHOLOGY

## 2020-06-02 NOTE — Lactation Note (Signed)
This note was copied from a baby's chart. Lactation Consultation Note  Patient Name: Lynn Cardenas OIBBC'W Date: 06/02/2020 Reason for consult: Follow-up assessment Age:28 hours   P1 mother whose infant is now 56 hours old.  This is an ETI at 38+2 weeks.  Mother is breast feeding and supplementing with donor breast milk.  Mother had no questions/concerns related to breast feeding.  She stated that she has received help and is feeling more comfortable with breast feeding.  She will continue to feed 8-12 times/24 hours or sooner if baby shows feeding cues.    Mother has a DEBP for home use and I provided our brochure with a follow up phone number if questions arise.  Father present.  Family has already been discharged.   Maternal Data    Feeding    LATCH Score                    Lactation Tools Discussed/Used    Interventions Interventions: Education  Discharge Discharge Education: Engorgement and breast care  Consult Status Consult Status: Complete Date: 06/02/20 Follow-up type: Call as needed    [name] [name] 06/02/2020, 9:43 AM

## 2020-06-03 ENCOUNTER — Other Ambulatory Visit: Payer: Self-pay

## 2020-06-03 ENCOUNTER — Inpatient Hospital Stay (HOSPITAL_BASED_OUTPATIENT_CLINIC_OR_DEPARTMENT_OTHER): Payer: BC Managed Care – PPO

## 2020-06-03 ENCOUNTER — Observation Stay (HOSPITAL_COMMUNITY)
Admission: AD | Admit: 2020-06-03 | Discharge: 2020-06-05 | Disposition: A | Discharge status: Home / Self Care | Payer: BC Managed Care – PPO | Attending: Obstetrics & Gynecology | Admitting: Obstetrics & Gynecology

## 2020-06-03 ENCOUNTER — Encounter (HOSPITAL_COMMUNITY): Payer: Self-pay | Admitting: Obstetrics & Gynecology

## 2020-06-03 ENCOUNTER — Inpatient Hospital Stay (HOSPITAL_COMMUNITY): Payer: BC Managed Care – PPO

## 2020-06-03 DIAGNOSIS — I422 Other hypertrophic cardiomyopathy: Secondary | ICD-10-CM | POA: Diagnosis not present

## 2020-06-03 DIAGNOSIS — R609 Edema, unspecified: Secondary | ICD-10-CM

## 2020-06-03 DIAGNOSIS — R2243 Localized swelling, mass and lump, lower limb, bilateral: Secondary | ICD-10-CM | POA: Diagnosis not present

## 2020-06-03 DIAGNOSIS — I5031 Acute diastolic (congestive) heart failure: Secondary | ICD-10-CM

## 2020-06-03 DIAGNOSIS — I428 Other cardiomyopathies: Secondary | ICD-10-CM | POA: Diagnosis not present

## 2020-06-03 DIAGNOSIS — D509 Iron deficiency anemia, unspecified: Secondary | ICD-10-CM | POA: Diagnosis present

## 2020-06-03 DIAGNOSIS — Z20822 Contact with and (suspected) exposure to covid-19: Secondary | ICD-10-CM | POA: Diagnosis not present

## 2020-06-03 DIAGNOSIS — R079 Chest pain, unspecified: Secondary | ICD-10-CM

## 2020-06-03 DIAGNOSIS — R051 Acute cough: Secondary | ICD-10-CM | POA: Diagnosis not present

## 2020-06-03 DIAGNOSIS — R0602 Shortness of breath: Secondary | ICD-10-CM | POA: Insufficient documentation

## 2020-06-03 DIAGNOSIS — E876 Hypokalemia: Secondary | ICD-10-CM | POA: Diagnosis not present

## 2020-06-03 DIAGNOSIS — O903 Peripartum cardiomyopathy: Principal | ICD-10-CM | POA: Diagnosis present

## 2020-06-03 DIAGNOSIS — I429 Cardiomyopathy, unspecified: Secondary | ICD-10-CM | POA: Diagnosis not present

## 2020-06-03 DIAGNOSIS — O99893 Other specified diseases and conditions complicating puerperium: Secondary | ICD-10-CM | POA: Diagnosis not present

## 2020-06-03 DIAGNOSIS — E871 Hypo-osmolality and hyponatremia: Secondary | ICD-10-CM | POA: Diagnosis not present

## 2020-06-03 LAB — PROTEIN / CREATININE RATIO, URINE
Creatinine, Urine: 24.39 mg/dL
Protein Creatinine Ratio: 0.57 mg/mg{Cre} — ABNORMAL HIGH (ref 0.00–0.15)
Total Protein, Urine: 14 mg/dL

## 2020-06-03 LAB — URINALYSIS, ROUTINE W REFLEX MICROSCOPIC
Bilirubin Urine: NEGATIVE
Glucose, UA: NEGATIVE mg/dL
Ketones, ur: NEGATIVE mg/dL
Nitrite: NEGATIVE
Protein, ur: NEGATIVE mg/dL
Specific Gravity, Urine: 1.003 — ABNORMAL LOW (ref 1.005–1.030)
pH: 7 (ref 5.0–8.0)

## 2020-06-03 LAB — ECHOCARDIOGRAM COMPLETE
AR max vel: 1.72 cm2
AV Area VTI: 1.87 cm2
AV Area mean vel: 1.77 cm2
AV Mean grad: 3 mmHg
AV Peak grad: 8.2 mmHg
Ao pk vel: 1.43 m/s
Area-P 1/2: 3.83 cm2
Height: 60 in
MV VTI: 2.77 cm2
S' Lateral: 2.7 cm
Weight: 2057.6 [oz_av]

## 2020-06-03 LAB — COMPREHENSIVE METABOLIC PANEL WITH GFR
ALT: 40 U/L (ref 0–44)
AST: 62 U/L — ABNORMAL HIGH (ref 15–41)
Albumin: 2.8 g/dL — ABNORMAL LOW (ref 3.5–5.0)
Alkaline Phosphatase: 119 U/L (ref 38–126)
Anion gap: 8 (ref 5–15)
BUN: 5 mg/dL — ABNORMAL LOW (ref 6–20)
CO2: 22 mmol/L (ref 22–32)
Calcium: 9.1 mg/dL (ref 8.9–10.3)
Chloride: 103 mmol/L (ref 98–111)
Creatinine, Ser: 0.57 mg/dL (ref 0.44–1.00)
GFR, Estimated: >60 mL/min
Glucose, Bld: 66 mg/dL — ABNORMAL LOW (ref 70–99)
Potassium: 4 mmol/L (ref 3.5–5.1)
Sodium: 133 mmol/L — ABNORMAL LOW (ref 135–145)
Total Bilirubin: 1.4 mg/dL — ABNORMAL HIGH (ref 0.3–1.2)
Total Protein: 5.9 g/dL — ABNORMAL LOW (ref 6.5–8.1)

## 2020-06-03 LAB — CBC
HCT: 26.5 % — ABNORMAL LOW (ref 36.0–46.0)
Hemoglobin: 8.9 g/dL — ABNORMAL LOW (ref 12.0–15.0)
MCH: 31.1 pg (ref 26.0–34.0)
MCHC: 33.6 g/dL (ref 30.0–36.0)
MCV: 92.7 fL (ref 80.0–100.0)
Platelets: 318 10*3/uL (ref 150–400)
RBC: 2.86 MIL/uL — ABNORMAL LOW (ref 3.87–5.11)
RDW: 13.9 % (ref 11.5–15.5)
WBC: 5.9 10*3/uL (ref 4.0–10.5)
nRBC: 0 % (ref 0.0–0.2)

## 2020-06-03 LAB — RESP PANEL BY RT-PCR (FLU A&B, COVID) ARPGX2
Influenza A by PCR: NEGATIVE
Influenza B by PCR: NEGATIVE
SARS Coronavirus 2 by RT PCR: NEGATIVE

## 2020-06-03 LAB — BRAIN NATRIURETIC PEPTIDE: B Natriuretic Peptide: 2642.8 pg/mL — ABNORMAL HIGH (ref 0.0–100.0)

## 2020-06-03 MED ORDER — ACETAMINOPHEN 325 MG PO TABS: 650.0000 mg | ORAL_TABLET | ORAL | Status: DC | PRN

## 2020-06-03 MED ORDER — SODIUM CHLORIDE 0.9 % IV SOLN: Freq: Once | INTRAVENOUS | Status: AC

## 2020-06-03 MED ORDER — FUROSEMIDE 10 MG/ML IJ SOLN
20.0000 mg | Freq: Once | INTRAMUSCULAR | Status: AC
  Administered 2020-06-03: 20 mg via INTRAMUSCULAR
  Filled 2020-06-03: qty 2

## 2020-06-03 MED ORDER — SODIUM CHLORIDE 0.9 % IV SOLN
510.0000 mg | Freq: Once | INTRAVENOUS | Status: AC
  Administered 2020-06-03: 510 mg via INTRAVENOUS
  Filled 2020-06-03 (×2): qty 17

## 2020-06-03 MED ORDER — FUROSEMIDE 10 MG/ML IJ SOLN
20.0000 mg | Freq: Two times a day (BID) | INTRAMUSCULAR | Status: DC
  Administered 2020-06-04: 20 mg via INTRAVENOUS
  Filled 2020-06-03: qty 2

## 2020-06-03 MED ORDER — PRENATAL MULTIVITAMIN CH
1.0000 | ORAL_TABLET | Freq: Every day | ORAL | Status: DC
  Administered 2020-06-04 – 2020-06-05 (×2): 1 via ORAL
  Filled 2020-06-03 (×2): qty 1

## 2020-06-03 MED ORDER — CALCIUM CARBONATE ANTACID 500 MG PO CHEW: 2.0000 | CHEWABLE_TABLET | ORAL | Status: DC | PRN

## 2020-06-03 NOTE — H&P (Signed)
Lynn Cardenas is an 30 y.o. female G1P1001 PPD # 4 after a normal spontaneous vaginal delivery who presented complaining of shortness of breath since delivery that worsens when lying down.  She also reports bilateral lower extremity swelling since 36 weeks of pregnancy that has worsened recently.  She has had intermittent cough.  She reports light vaginal bleeding.  She denies abdominal pain.  She is only breastfeeding.        Past Medical History:  Diagnosis Date  . Fibroid   . 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 NO PAST SURGERIES      History reviewed. No pertinent family history.  Social History:  reports that she has never smoked. She has never used smokeless tobacco. She reports that she does not drink alcohol and does not use drugs.  Allergies:  Allergies  Allergen Reactions  . Penicillins     Medications Prior to Admission  Medication Sig Dispense Refill Last Dose  . Ascorbic Acid (VITAMIN C) 100 MG CHEW Chew by mouth.   06/03/2020 at Unknown time  . Prenatal Vit-Fe Fumarate-FA (PRENATAL MULTIVITAMIN) TABS tablet Take 1 tablet by mouth daily at 12 noon.   06/03/2020 at Unknown time  . famotidine (PEPCID) 20 MG tablet Take 1 tablet (20 mg total) by mouth 2 (two) times daily for 365 doses. 60 tablet 1   . ibuprofen (ADVIL) 600 MG tablet Take 1 tablet (600 mg total) by mouth every 6 (six) hours. 30 tablet 0   . iron polysaccharides (NIFEREX) 150 MG capsule Take 1 capsule (150 mg total) by mouth daily. 30 capsule 2     Review of Systems  Constitutional: Denies fevers/chills Cardiovascular: Denies chest pain or palpitations.  With shortness of breath.  With orthopnea.  Pulmonary: Denies wheezing. With cough.  Gastrointestinal: Denies nausea, vomiting or diarrhea Genitourinary: Denies pelvic pain, unusual vaginal bleeding, unusual vaginal discharge, dysuria, urgency or frequency.   Musculoskeletal: Denies muscle or joint aches and pain.  Neurology: Denies abnormal sensations such as tingling or numbness.    Blood pressure 124/80, pulse 91, temperature 97.8 F (36.6 C), temperature source Oral, resp. rate 15, height 5' (1.524 m), weight 58.3 kg, SpO2 99 %, currently breastfeeding. Physical Exam  Vitals:   06/03/20 1731 06/03/20 1841 06/03/20 1855 06/03/20 1905  BP: 129/68 124/80    Pulse: 86 91    Temp: 97.8 F (36.6 C) 97.8 F (36.6 C)    Resp: 15 16 15    Height:      Weight:      SpO2: 100% 100% 99% 99%  TempSrc: Axillary Oral    BMI (Calculated):       Physical Exam  Constitutional: She is oriented to person, place, and time. She appears well-developed and well-nourished.  HENT:  Head: Normocephalic and atraumatic.  Neck: Normal range of motion.  Cardiovascular: Normal rate, regular rhythm and normal heart sounds.   Respiratory: Effort normal and breath sounds normal.  GI: Soft. Bowel sounds are normal.  Neurological: She is alert and oriented to person, place, and time.  Skin: Skin is warm and dry.  Psychiatric: She has a normal mood and affect. Her behavior is normal.  Extremities: Warm and well perfused. 2+ pitting edema bilaterally in legs bilaterally, no calf tenderness bilaterally, no erythema bilaterally.   Results for orders placed or performed during the hospital encounter of 06/03/20 (from the past 24 hour(s))  Urinalysis, Routine w reflex microscopic Urine, Clean Catch     Status: Abnormal   Collection Time: 06/03/20  1:35 PM  Result Value Ref Range   Color, Urine YELLOW YELLOW   APPearance CLEAR CLEAR   Specific Gravity, Urine 1.003 (L) 1.005 - 1.030   pH 7.0 5.0 - 8.0   Glucose, UA NEGATIVE NEGATIVE mg/dL   Hgb urine dipstick LARGE (A) NEGATIVE   Bilirubin Urine NEGATIVE NEGATIVE   Ketones, ur NEGATIVE NEGATIVE mg/dL   Protein, ur NEGATIVE NEGATIVE mg/dL   Nitrite NEGATIVE NEGATIVE   Leukocytes,Ua LARGE (A) NEGATIVE   RBC / HPF  0-5 0 - 5 RBC/hpf   WBC, UA 11-20 0 - 5 WBC/hpf   Bacteria, UA RARE (A) NONE SEEN   Squamous Epithelial / LPF 0-5 0 - 5   Non Squamous Epithelial 0-5 (A) NONE SEEN  Resp Panel by RT-PCR (Flu A&B, Covid) Nasopharyngeal Swab     Status: None   Collection Time: 06/03/20  1:45 PM   Specimen: Nasopharyngeal Swab; Nasopharyngeal(NP) swabs in vial transport medium  Result Value Ref Range   SARS Coronavirus 2 by RT PCR NEGATIVE NEGATIVE   Influenza A by PCR NEGATIVE NEGATIVE   Influenza B by PCR NEGATIVE NEGATIVE  CBC     Status: Abnormal   Collection Time: 06/03/20  2:44 PM  Result Value Ref Range   WBC 5.9 4.0 - 10.5 K/uL   RBC 2.86 (L) 3.87 - 5.11 MIL/uL   Hemoglobin 8.9 (L) 12.0 - 15.0 g/dL   HCT 26.5 (L) 36.0 - 46.0 %   MCV 92.7 80.0 - 100.0 fL   MCH 31.1 26.0 - 34.0 pg   MCHC 33.6 30.0 - 36.0 g/dL   RDW 13.9 11.5 - 15.5 %   Platelets 318 150 - 400 K/uL   nRBC 0.0 0.0 - 0.2 %  Comprehensive metabolic panel     Status: Abnormal   Collection Time: 06/03/20  4:00 PM  Result Value Ref Range   Sodium 133 (L) 135 - 145 mmol/L   Potassium 4.0 3.5 - 5.1 mmol/L   Chloride 103 98 - 111 mmol/L   CO2 22 22 - 32 mmol/L   Glucose, Bld 66 (L) 70 - 99 mg/dL   BUN 5 (L) 6 - 20 mg/dL   Creatinine, Ser 0.57 0.44 - 1.00 mg/dL   Calcium 9.1 8.9 - 10.3 mg/dL   Total Protein 5.9 (L) 6.5 - 8.1 g/dL   Albumin 2.8 (L) 3.5 - 5.0 g/dL   AST 62 (H) 15 - 41 U/L   ALT 40 0 - 44 U/L   Alkaline Phosphatase 119 38 - 126 U/L   Total Bilirubin 1.4 (H) 0.3 - 1.2 mg/dL   GFR, Estimated >60 >60 mL/min   Anion gap 8 5 - 15    DG Chest Port 1 View  Result Date: 06/03/2020 CLINICAL DATA:  Shortness of breath.  Postpartum. EXAM: PORTABLE CHEST 1 VIEW COMPARISON:  None. FINDINGS: No significant airspace disease or lung consolidation. Prominent structures in left suprahilar region could be related to prominent vascular structures. Evidence for a small azygos lobe. Heart size is upper limits of normal but likely  accentuated by the AP portable technique. IMPRESSION: 1. Slightly prominent central vascular structures possibly related to recent pregnancy. 2. No focal airspace disease. Electronically Signed   By: Markus Daft M.D.   On: 06/03/2020 15:12   ECHOCARDIOGRAM COMPLETE  Result Date: 06/03/2020    ECHOCARDIOGRAM REPORT   Patient Name:  Lynn Cardenas Date of Exam: 06/03/2020 Medical Rec #:  [id number]               Height:       60.0 in Accession #:    [id number]              Weight:       128.6 lb Date of Birth:  [birth year]               BSA:          1.547 m Patient Age:    30 years                BP:           129/68 mmHg Patient Gender: F                       HR:           86 bpm. Exam Location:  Inpatient Procedure: 2D Echo, Cardiac Doppler and Color Doppler Indications:    Other abnormalities of the heart  History:        Patient has no prior history of Echocardiogram examinations.                 Signs/Symptoms:Shortness of Breath. Patient four days                 post-partum. Orthopnea.  Sonographer:    Clayton Lefort RDCS (AE) Referring Phys: [id number] Darlina Rumpf  Sonographer Comments: Patient in Fowler's position due to orthopnea. IMPRESSIONS  1. 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 There is mid-cavity obliteration and a small volume of apical entrapment. There is no LV outflow obstruction. Left ventricular ejection fraction, by estimation, is  >75%. The left ventricle has hyperdynamic function. The left ventricle has no regional wall motion abnormalities. There is severe asymmetric left ventricular hypertrophy of the apical segment. Left ventricular diastolic parameters are consistent with Grade II diastolic dysfunction (pseudonormalization). Elevated left atrial pressure.  2. Right ventricular systolic function is normal. The right ventricular size is normal. There is moderately elevated pulmonary artery systolic pressure.  3. Left atrial size  was severely dilated.  4. The pericardial effusion is circumferential.  5. The mitral valve is normal in structure. Trivial mitral valve regurgitation.  6. The aortic valve is tricuspid. Aortic valve regurgitation is not visualized. No aortic stenosis is present.  7. There is prominent a wave reversal in the hepatic veins (elevated RV end-diastolic pressure). The inferior vena cava is dilated in size with <50% respiratory variability, suggesting right atrial pressure of 15 mmHg. 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 There is mid-cavity obliteration and a small volume of apical entrapment. There is no LV outflow obstruction. Left ventricular ejection fraction, by estimation, is >75%. The left ventricle has hyperdynamic function. The left ventricle has no regional wall motion abnormalities. The left ventricular internal cavity size was small. There is severe asymmetric left ventricular hypertrophy of the apical segment. Left ventricular diastolic parameters are consistent with Grade II diastolic dysfunction (pseudonormalization). Elevated left atrial pressure. Right Ventricle: The right ventricular size is normal. No increase in right ventricular wall thickness. Right ventricular systolic function is normal. There is moderately elevated pulmonary artery systolic pressure. The tricuspid regurgitant velocity is 3.07 m/s, and with an assumed right atrial pressure of 15 mmHg, the estimated right ventricular systolic pressure is 94.7 mmHg. Left  Atrium: Left atrial size was severely dilated. Right Atrium: Right atrial size was normal in size. Pericardium: Trivial pericardial effusion is present. The pericardial effusion is circumferential. Mitral Valve: The mitral valve is normal in structure. Trivial mitral valve regurgitation. MV peak gradient, 2.4 mmHg. The mean mitral valve gradient is 1.0 mmHg. Tricuspid Valve: The tricuspid valve is normal in  structure. Tricuspid valve regurgitation is trivial. Aortic Valve: The aortic valve is tricuspid. Aortic valve regurgitation is not visualized. No aortic stenosis is present. Aortic valve mean gradient measures 3.0 mmHg. Aortic valve peak gradient measures 8.2 mmHg. Aortic valve area, by VTI measures 1.87 cm. Pulmonic Valve: The pulmonic valve was normal in structure. Pulmonic valve regurgitation is not visualized. Aorta: The aortic root is normal in size and structure. Venous: There is prominent a wave reversal in the hepatic veins (elevated RV end-diastolic pressure). The inferior vena cava is dilated in size with less than 50% respiratory variability, suggesting right atrial pressure of 15 mmHg. IAS/Shunts: No atrial level shunt detected by color flow Doppler.  LEFT VENTRICLE PLAX 2D LVIDd:         3.70 cm  Diastology LVIDs:         2.70 cm  LV e' medial:    3.08 cm/s LV PW:         1.90 cm  LV E/e' medial:  15.1 LV IVS:        1.65 cm  LV e' lateral:   3.45 cm/s LVOT diam:     1.60 cm  LV E/e' lateral: 13.5 LV SV:         39 LV SV Index:   25 LVOT Area:     2.01 cm  RIGHT VENTRICLE             IVC RV Basal diam:  2.90 cm     IVC diam: 2.50 cm RV S prime:     10.60 cm/s TAPSE (M-mode): 1.7 cm LEFT ATRIUM              Index       RIGHT ATRIUM           Index LA diam:        4.56 cm  2.95 cm/m  RA Area:     20.60 cm LA Vol (A2C):   117.0 ml 75.62 ml/m RA Volume:   60.80 ml  39.30 ml/m LA Vol (A4C):   112.0 ml 72.39 ml/m LA Biplane Vol: 115.0 ml 74.33 ml/m  AORTIC VALVE AV Area (Vmax):    1.72 cm AV Area (Vmean):   1.77 cm AV Area (VTI):     1.87 cm AV Vmax:           143.00 cm/s AV Vmean:          82.400 cm/s AV VTI:            0.211 m AV Peak Grad:      8.2 mmHg AV Mean Grad:      3.0 mmHg LVOT Vmax:         122.00 cm/s LVOT Vmean:        72.500 cm/s LVOT VTI:          0.196 m LVOT/AV VTI ratio: 0.93  AORTA Ao Root diam: 3.10 cm Ao Asc diam:  2.50 cm MITRAL VALVE               TRICUSPID VALVE MV Area  (PHT): 3.83 cm    TR Peak grad:  37.7 mmHg MV Area VTI:   2.77 cm    TR Vmax:        307.00 cm/s MV Peak grad:  2.4 mmHg MV Mean grad:  1.0 mmHg    SHUNTS MV Vmax:       0.78 m/s    Systemic VTI:  0.20 m MV Vmean:      41.8 cm/s   Systemic Diam: 1.60 cm MV Decel Time: 198 msec MV E velocity: 46.50 cm/s MV A velocity: 31.50 cm/s MV E/A ratio:  1.48 Mihai Croitoru MD Electronically signed by Sanda Klein MD Signature Date/Time: 06/03/2020/7:01:11 PM    Final    VAS [place] LOWER EXTREMITY VENOUS (DVT) (ONLY [facility] & [facility])  Result Date: 06/03/2020  Lower Venous DVT Study Patient Name:  Lynn Cardenas  Date of Exam:   06/03/2020 Medical Rec #: [id number]                Accession #:    [id number] Date of Birth: 1990-11-22                Patient Gender: F Patient Age:   25Y Exam Location:  [facility] Procedure:      VAS [place] LOWER EXTREMITY VENOUS (DVT) Referring Phys: [id number] Darlina Rumpf --------------------------------------------------------------------------------  Indications: 4 days post partum with new onset worsening pitting edema, shortness of breath, and slight cough.  Comparison Study: No prior study Performing Technologist: Maudry Mayhew MHA, RDMS, RVT, RDCS  Examination Guidelines: A complete evaluation includes B-mode imaging, spectral Doppler, color Doppler, and power Doppler as needed of all accessible portions of each vessel. Bilateral testing is considered an integral part of a complete examination. Limited examinations for reoccurring indications may be performed as noted. The reflux portion of the exam is performed with the patient in reverse Trendelenburg.  +---------+---------------+---------+-----------+----------+--------------+ RIGHT    CompressibilityPhasicitySpontaneityPropertiesThrombus Aging +---------+---------------+---------+-----------+----------+--------------+ CFV      Full           No       Yes                                  +---------+---------------+---------+-----------+----------+--------------+ SFJ      Full                                                        +---------+---------------+---------+-----------+----------+--------------+ FV Prox  Full                                                        +---------+---------------+---------+-----------+----------+--------------+ FV Mid   Full                                                        +---------+---------------+---------+-----------+----------+--------------+ FV DistalFull                                                        +---------+---------------+---------+-----------+----------+--------------+  PFV      Full                                                        +---------+---------------+---------+-----------+----------+--------------+ POP      Full           No       Yes                                 +---------+---------------+---------+-----------+----------+--------------+ PTV      Full                                                        +---------+---------------+---------+-----------+----------+--------------+ PERO     Full                                                        +---------+---------------+---------+-----------+----------+--------------+   +---------+---------------+---------+-----------+----------+--------------+ LEFT     CompressibilityPhasicitySpontaneityPropertiesThrombus Aging +---------+---------------+---------+-----------+----------+--------------+ CFV      Full           No       Yes                                 +---------+---------------+---------+-----------+----------+--------------+ SFJ      Full                                                        +---------+---------------+---------+-----------+----------+--------------+ FV Prox  Full                                                         +---------+---------------+---------+-----------+----------+--------------+ FV Mid   Full                                                        +---------+---------------+---------+-----------+----------+--------------+ FV DistalFull                                                        +---------+---------------+---------+-----------+----------+--------------+ PFV      Full                                                        +---------+---------------+---------+-----------+----------+--------------+  POP      Full           No       Yes                                 +---------+---------------+---------+-----------+----------+--------------+ PTV      Full                                                        +---------+---------------+---------+-----------+----------+--------------+ PERO     Full                                                        +---------+---------------+---------+-----------+----------+--------------+    Summary: RIGHT: - There is no evidence of deep vein thrombosis in the lower extremity.  - No cystic structure found in the popliteal fossa.  LEFT: - There is no evidence of deep vein thrombosis in the lower extremity.  - No cystic structure found in the popliteal fossa.  Pulsatile lower extremity venous flow is suggestive of possibly elevated right-heart pressure.  *See table(s) above for measurements and observations.    Preliminary    Assessment/Plan: 30 y/o Para 1 PPD # 4 s/p NSVD here with shortness of breath, cough, lower extremity edema.  With postpartum hypertrophic cardiomyopathy, -Admit to OB-Specialty care under OB service.  -Cardiology was consulted and they recommend IV lasix, daily weight checks, Strict Input and output follow up, BMP in AM. - Patient may continue with breastfeeding and pumping as before.  Archie Endo, MD.  06/03/2020, 8:43 PM

## 2020-06-03 NOTE — MAU Note (Signed)
Called lab to check on status of UA (sent at 1335) and COVID swab (sent at 1345). Lab stated they would run the specimens now.

## 2020-06-03 NOTE — Consult Note (Addendum)
Cardiology Consultation:   Patient ID: Lynn Cardenas; [id number]; 12/20/1990   Admit date: 06/03/2020 Date of Consult: 06/03/2020  Primary Care Provider: Patient, No Pcp Per (Inactive) Primary Cardiologist: New to [facility]   Patient Profile:   Lynn Cardenas is a 30 y.o. female with a no known PMH who is being seen today for the evaluation of abnormal echoardiogram at the request of Dr.   History of Present Illness:   Lynn Cardenas is a 30yo F with no known PMH who presented to [facility] with leakage of fluids in active labor 05/30/20. She ultimately had a vaginal delivery and was discharged 06/01/20. She represented to her OB for follow up with SOB, BLE and recurrent cough. Given this, echocardiogram and LE doppler study was ordered. Doppler showed no evidence of DVT. Unfortunately, echo showed hypertrophic cardiomyopathy, apical variant with mid-cavity obliteration and a small volume of apical entrapment. There is no LV outflow obstruction. There is G2DD, moderately elevated PSAP, severely dilated LA, small circumferential pericardial effusion, and trivial TR.    She was admitted back from MAU with cardiology consult given the above. She was given 20mg  IM Lasix with improvement. MAU working on IV access today.  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 Authorizing Provider  Ascorbic Acid (VITAMIN C) 100 MG CHEW Chew by mouth.   Yes [provider]  Prenatal Vit-Fe Fumarate-FA (PRENATAL MULTIVITAMIN) TABS tablet Take 1 tablet by mouth daily at 12 noon.   Yes [provider]  famotidine (PEPCID) 20 MG tablet Take 1 tablet (20 mg total) by mouth 2 (two) times daily for 365 doses. 05/27/20 11/26/20  Rasch, Anderson Malta I, NP  ibuprofen (ADVIL) 600  MG tablet Take 1 tablet (600 mg total) by mouth every 6 (six) hours. 06/01/20   Noralyn Pick, FNP  iron polysaccharides (NIFEREX) 150 MG capsule Take 1 capsule (150 mg total) by mouth daily. 06/01/20   Noralyn Pick, FNP    Inpatient Medications: Scheduled Meds:   Continuous Infusions:  ferumoxytol 510 mg (06/03/20 1842)   PRN Meds:   Allergies:    Allergies  Allergen Reactions   Penicillins     Social History:   Social History   Socioeconomic History   Marital status: Married    Spouse name: Not on file   Number of children: Not on file   Years of education: Not on file   Highest education level: Not on file  Occupational History   Not on file  Tobacco Use   Smoking status: Never Smoker   Smokeless tobacco: Never Used  Vaping Use   Vaping Use: Never used  Substance and Sexual Activity   Alcohol use: Never   Drug use: Never   Sexual activity: Not Currently  Other Topics Concern   Not on file  Social History Narrative   Not on file   Social Determinants of Health   Financial Resource Strain: Not on file  Food Insecurity: Not on file  Transportation Needs: Not on file  Physical Activity: Not on file  Stress: Not on file  Social Connections: Not on file  Intimate Partner Violence: Not on file    Family History:   History reviewed. No pertinent  family history. Family Status:  No family status information on file.    ROS:  Please see the history of present illness.  All other ROS reviewed and negative.     Physical Exam/Data:   Vitals:   06/03/20 1358 06/03/20 1731 06/03/20 1841 06/03/20 1855  BP: 129/80 129/68 124/80   Pulse: 93 86 91   Resp:  15 16 15   Temp:  97.8 F (36.6 C) 97.8 F (36.6 C)   TempSrc:  Axillary Oral   SpO2: 100% 100% 100% 99%  Weight:      Height:       No intake or output data in the 24 hours ending 06/03/20 1909 Filed Weights   06/03/20 1327  Weight: 58.3 kg   Body mass index is 25.12 kg/m.   General: Well  developed, well nourished, NAD Neck: Negative for carotid bruits. + JVD Lungs: Bilateral lower lobe crackles. Breathing is unlabored. Cardiovascular: RRR with S1 S2 S3. No murmur Extremities: 3+BLE edema. Radial pulses 2+ bilaterally Neuro: Alert and oriented. No focal deficits. No facial asymmetry. MAE spontaneously. Psych: Responds to questions appropriately with normal affect.     EKG:  The EKG was personally reviewed and demonstrates:  NSR with evidence of LVH Telemetry:  Telemetry was personally reviewed and demonstrates:   Relevant CV Studies:  ECHOCARDIOGRAM : 06/03/20:   1. The overall appearance is consistent with hypertrophic cardiomyopathy,  apical variant. There is mid-cavity obliteration and a small volume of  apical entrapment. There is no LV outflow obstruction. Left ventricular  ejection fraction, by estimation, is   >75%. The left ventricle has hyperdynamic function. The left ventricle  has no regional wall motion abnormalities. There is severe asymmetric left  ventricular hypertrophy of the apical segment. Left ventricular diastolic  parameters are consistent with  Grade II diastolic dysfunction (pseudonormalization). Elevated left atrial  pressure.   2. Right ventricular systolic function is normal. The right ventricular  size is normal. There is moderately elevated pulmonary artery systolic  pressure.   3. Left atrial size was severely dilated.   4. The pericardial effusion is circumferential.   5. The mitral valve is normal in structure. Trivial mitral valve  regurgitation.   6. The aortic valve is tricuspid. Aortic valve regurgitation is not  visualized. No aortic stenosis is present.   7. There is prominent a wave reversal in the hepatic veins (elevated RV  end-diastolic pressure). The inferior vena cava is dilated in size with  <50% respiratory variability, suggesting right atrial pressure of 15 mmHg.   Laboratory Data:  Chemistry Recent Labs  Lab  06/03/20 1600  NA 133*  K 4.0  CL 103  CO2 22  GLUCOSE 66*  BUN 5*  CREATININE 0.57  CALCIUM 9.1  GFRNONAA >60  ANIONGAP 8    Total Protein  Date Value Ref Range Status  06/03/2020 5.9 (L) 6.5 - 8.1 g/dL Final   Albumin  Date Value Ref Range Status  06/03/2020 2.8 (L) 3.5 - 5.0 g/dL Final   AST  Date Value Ref Range Status  06/03/2020 62 (H) 15 - 41 U/L Final   ALT  Date Value Ref Range Status  06/03/2020 40 0 - 44 U/L Final   Alkaline Phosphatase  Date Value Ref Range Status  06/03/2020 119 38 - 126 U/L Final   Total Bilirubin  Date Value Ref Range Status  06/03/2020 1.4 (H) 0.3 - 1.2 mg/dL Final   Hematology Recent Labs  Lab 05/30/20 1618 05/31/20 0528 06/03/20  1444  WBC 4.2 13.9* 5.9  RBC 3.57* 3.08* 2.86*  HGB 11.0* 9.5* 8.9*  HCT 34.0* 27.6* 26.5*  MCV 95.2 89.6 92.7  MCH 30.8 30.8 31.1  MCHC 32.4 34.4 33.6  RDW 13.2 13.2 13.9  PLT 342 293 318   Cardiac EnzymesNo results for input(s): TROPONINI in the last 168 hours. No results for input(s): TROPIPOC in the last 168 hours.  BNPNo results for input(s): BNP, PROBNP in the last 168 hours.  DDimer No results for input(s): DDIMER in the last 168 hours. TSH: No results found for: TSH Lipids:No results found for: CHOL, HDL, LDLCALC, LDLDIRECT, TRIG, CHOLHDL HgbA1c:No results found for: HGBA1C  Radiology/Studies:  DG Chest Port 1 View  Result Date: 06/03/2020 CLINICAL DATA:  Shortness of breath.  Postpartum. EXAM: PORTABLE CHEST 1 VIEW COMPARISON:  None. FINDINGS: No significant airspace disease or lung consolidation. Prominent structures in left suprahilar region could be related to prominent vascular structures. Evidence for a small azygos lobe. Heart size is upper limits of normal but likely accentuated by the AP portable technique. IMPRESSION: 1. Slightly prominent central vascular structures possibly related to recent pregnancy. 2. No focal airspace disease. Electronically Signed   By: Markus Daft M.D.    On: 06/03/2020 15:12   ECHOCARDIOGRAM COMPLETE  Result Date: 06/03/2020    ECHOCARDIOGRAM REPORT   Patient Name:   TASHEIKA KITZMILLER Date of Exam: 06/03/2020 Medical Rec #:  [id number]               Height:       60.0 in Accession #:    [id number]              Weight:       128.6 lb Date of Birth:  10-May-1990               BSA:          1.547 m Patient Age:    30 years                BP:           129/68 mmHg Patient Gender: F                       HR:           86 bpm. Exam Location:  Inpatient Procedure: 2D Echo, Cardiac Doppler and Color Doppler Indications:    Other abnormalities of the heart  History:        Patient has no prior history of Echocardiogram examinations.                 Signs/Symptoms:Shortness of Breath. Patient four days                 post-partum. Orthopnea.  Sonographer:    Clayton Lefort RDCS (AE) Referring Phys: [id number] Darlina Rumpf  Sonographer Comments: Patient in Fowler's position due to orthopnea. IMPRESSIONS  1. 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 There is mid-cavity obliteration and a small volume of apical entrapment. There is no LV outflow obstruction. Left ventricular ejection fraction, by estimation, is  >75%. The left ventricle has hyperdynamic function. The left ventricle has no regional wall motion abnormalities. There is severe asymmetric left ventricular hypertrophy of the apical segment. Left ventricular diastolic parameters are consistent with Grade II diastolic dysfunction (pseudonormalization). Elevated left atrial pressure.  2. Right ventricular systolic function is normal. The right ventricular size is normal.  There is moderately elevated pulmonary artery systolic pressure.  3. Left atrial size was severely dilated.  4. The pericardial effusion is circumferential.  5. The mitral valve is normal in structure. Trivial mitral valve regurgitation.  6. The aortic valve is tricuspid. Aortic valve regurgitation is  not visualized. No aortic stenosis is present.  7. There is prominent a wave reversal in the hepatic veins (elevated RV end-diastolic pressure). The inferior vena cava is dilated in size with <50% respiratory variability, suggesting right atrial pressure of 15 mmHg. 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 There is mid-cavity obliteration and a small volume of apical entrapment. There is no LV outflow obstruction. Left ventricular ejection fraction, by estimation, is >75%. The left ventricle has hyperdynamic function. The left ventricle has no regional wall motion abnormalities. The left ventricular internal cavity size was small. There is severe asymmetric left ventricular hypertrophy of the apical segment. Left ventricular diastolic parameters are consistent with Grade II diastolic dysfunction (pseudonormalization). Elevated left atrial pressure. Right Ventricle: The right ventricular size is normal. No increase in right ventricular wall thickness. Right ventricular systolic function is normal. There is moderately elevated pulmonary artery systolic pressure. The tricuspid regurgitant velocity is 3.07 m/s, and with an assumed right atrial pressure of 15 mmHg, the estimated right ventricular systolic pressure is 16.1 mmHg. Left Atrium: Left atrial size was severely dilated. Right Atrium: Right atrial size was normal in size. Pericardium: Trivial pericardial effusion is present. The pericardial effusion is circumferential. Mitral Valve: The mitral valve is normal in structure. Trivial mitral valve regurgitation. MV peak gradient, 2.4 mmHg. The mean mitral valve gradient is 1.0 mmHg. Tricuspid Valve: The tricuspid valve is normal in structure. Tricuspid valve regurgitation is trivial. Aortic Valve: The aortic valve is tricuspid. Aortic valve regurgitation is not visualized. No aortic stenosis is present. Aortic valve mean gradient measures 3.0  mmHg. Aortic valve peak gradient measures 8.2 mmHg. Aortic valve area, by VTI measures 1.87 cm. Pulmonic Valve: The pulmonic valve was normal in structure. Pulmonic valve regurgitation is not visualized. Aorta: The aortic root is normal in size and structure. Venous: There is prominent a wave reversal in the hepatic veins (elevated RV end-diastolic pressure). The inferior vena cava is dilated in size with less than 50% respiratory variability, suggesting right atrial pressure of 15 mmHg. IAS/Shunts: No atrial level shunt detected by color flow Doppler.  LEFT VENTRICLE PLAX 2D LVIDd:         3.70 cm  Diastology LVIDs:         2.70 cm  LV e' medial:    3.08 cm/s LV PW:         1.90 cm  LV E/e' medial:  15.1 LV IVS:        1.65 cm  LV e' lateral:   3.45 cm/s LVOT diam:     1.60 cm  LV E/e' lateral: 13.5 LV SV:         39 LV SV Index:   25 LVOT Area:     2.01 cm  RIGHT VENTRICLE             IVC RV Basal diam:  2.90 cm     IVC diam: 2.50 cm RV S prime:     10.60 cm/s TAPSE (M-mode): 1.7 cm LEFT ATRIUM              Index       RIGHT ATRIUM  Index LA diam:        4.56 cm  2.95 cm/m  RA Area:     20.60 cm LA Vol (A2C):   117.0 ml 75.62 ml/m RA Volume:   60.80 ml  39.30 ml/m LA Vol (A4C):   112.0 ml 72.39 ml/m LA Biplane Vol: 115.0 ml 74.33 ml/m  AORTIC VALVE AV Area (Vmax):    1.72 cm AV Area (Vmean):   1.77 cm AV Area (VTI):     1.87 cm AV Vmax:           143.00 cm/s AV Vmean:          82.400 cm/s AV VTI:            0.211 m AV Peak Grad:      8.2 mmHg AV Mean Grad:      3.0 mmHg LVOT Vmax:         122.00 cm/s LVOT Vmean:        72.500 cm/s LVOT VTI:          0.196 m LVOT/AV VTI ratio: 0.93  AORTA Ao Root diam: 3.10 cm Ao Asc diam:  2.50 cm MITRAL VALVE               TRICUSPID VALVE MV Area (PHT): 3.83 cm    TR Peak grad:   37.7 mmHg MV Area VTI:   2.77 cm    TR Vmax:        307.00 cm/s MV Peak grad:  2.4 mmHg MV Mean grad:  1.0 mmHg    SHUNTS MV Vmax:       0.78 m/s    Systemic VTI:  0.20 m MV Vmean:       41.8 cm/s   Systemic Diam: 1.60 cm MV Decel Time: 198 msec MV E velocity: 46.50 cm/s MV A velocity: 31.50 cm/s MV E/A ratio:  1.48 Acen Craun MD Electronically signed by Sanda Klein MD Signature Date/Time: 06/03/2020/7:01:11 PM    Final    VAS [place] LOWER EXTREMITY VENOUS (DVT) (ONLY [facility] & [facility])  Result Date: 06/03/2020  Lower Venous DVT Study Patient Name:  ARDIE DRAGOO  Date of Exam:   06/03/2020 Medical Rec #: [id number]                Accession #:    [id number] Date of Birth: 07-31-1990                Patient Gender: F Patient Age:   75Y Exam Location:  [facility] Procedure:      VAS [place] LOWER EXTREMITY VENOUS (DVT) Referring Phys: [id number] Darlina Rumpf --------------------------------------------------------------------------------  Indications: 4 days post partum with new onset worsening pitting edema, shortness of breath, and slight cough.  Comparison Study: No prior study Performing Technologist: Maudry Mayhew MHA, RDMS, RVT, RDCS  Examination Guidelines: A complete evaluation includes B-mode imaging, spectral Doppler, color Doppler, and power Doppler as needed of all accessible portions of each vessel. Bilateral testing is considered an integral part of a complete examination. Limited examinations for reoccurring indications may be performed as noted. The reflux portion of the exam is performed with the patient in reverse Trendelenburg.  +---------+---------------+---------+-----------+----------+--------------+ RIGHT    CompressibilityPhasicitySpontaneityPropertiesThrombus Aging +---------+---------------+---------+-----------+----------+--------------+ CFV      Full           No       Yes                                 +---------+---------------+---------+-----------+----------+--------------+  SFJ      Full                                                        +---------+---------------+---------+-----------+----------+--------------+ FV Prox   Full                                                        +---------+---------------+---------+-----------+----------+--------------+ FV Mid   Full                                                        +---------+---------------+---------+-----------+----------+--------------+ FV DistalFull                                                        +---------+---------------+---------+-----------+----------+--------------+ PFV      Full                                                        +---------+---------------+---------+-----------+----------+--------------+ POP      Full           No       Yes                                 +---------+---------------+---------+-----------+----------+--------------+ PTV      Full                                                        +---------+---------------+---------+-----------+----------+--------------+ PERO     Full                                                        +---------+---------------+---------+-----------+----------+--------------+   +---------+---------------+---------+-----------+----------+--------------+ LEFT     CompressibilityPhasicitySpontaneityPropertiesThrombus Aging +---------+---------------+---------+-----------+----------+--------------+ CFV      Full           No       Yes                                 +---------+---------------+---------+-----------+----------+--------------+ SFJ      Full                                                        +---------+---------------+---------+-----------+----------+--------------+  FV Prox  Full                                                        +---------+---------------+---------+-----------+----------+--------------+ FV Mid   Full                                                        +---------+---------------+---------+-----------+----------+--------------+ FV DistalFull                                                         +---------+---------------+---------+-----------+----------+--------------+ PFV      Full                                                        +---------+---------------+---------+-----------+----------+--------------+ POP      Full           No       Yes                                 +---------+---------------+---------+-----------+----------+--------------+ PTV      Full                                                        +---------+---------------+---------+-----------+----------+--------------+ PERO     Full                                                        +---------+---------------+---------+-----------+----------+--------------+    Summary: RIGHT: - There is no evidence of deep vein thrombosis in the lower extremity.  - No cystic structure found in the popliteal fossa.  LEFT: - There is no evidence of deep vein thrombosis in the lower extremity.  - No cystic structure found in the popliteal fossa.  Pulsatile lower extremity venous flow is suggestive of possibly elevated right-heart pressure.  *See table(s) above for measurements and observations.    Preliminary    Assessment and Plan:   1. Hypertrophic cardiomyopathy: -Pt with recent vaginal birth 05/30/2020>> discharged 06/01/2020 and presented for outpatient follow-up with her OB/GYN found to have worsening shortness of breath, LE edema and worsening cough.  Lower extremity Doppler study performed which is negative for DVT.  Echocardiogram showed hypertrophic cardiomyopathy, apical variant with mid-cavity obliteration and a small volume of apical entrapment. There is no LV outflow obstruction. There is G2DD, moderately elevated PSAP, severely dilated LA, small circumferential pericardial effusion, and trivial TR.   -She was admitted back from  MAU with cardiology consult given the above. She was given 20mg  IM Lasix with improvement. MAU working on IV access today.  -Plan for IV Lasix 20mg  BID and  follow response -Daily weight, strict I&O -BNP now and BMET in the AM   For questions or updates, please contact [employer] Please consult [email or web address] for contact info under Cardiology/STEMI.   SignedKathyrn Drown NP-C [facility] Pager: [phone] 06/03/2020 7:09 PM   I have seen and examined the patient along with Kathyrn Drown NP-C.  I have reviewed the chart, notes and new data.  I agree with PA/NP's note.  Key new complaints: breathing has improved with diuresis, but she is still orthopneic. There is no personal history of syncope and no family history of CHF, sudden death or arrhythmia. Key examination changes: RRR, mildly tachycardic, S3 gallop present, JVP 9-10 cm, 3+ soft pitting edema to knees bilaterally Key new findings / data: echo reviewed and discussed with patient, family and care team. Highly consistent with HCM, nonobstructive, primarily mid cavity and apical involvement.  PLAN: Admit overnight. IV diuretics. BMET in AM. We will see again in AM. Will almost certainly require oral diuretics at DC. Furosemide, dosed judiciously, should be OK w breast feeding. Early clinical follow up after DC. Consider outpatient arrhythmia monitor for risk stratification. Discussed concept of "dry weight", need for dietary sodium restriction, signs and symptoms of HF exacerbation. Briefly reviewed possible role for genetic testing.  Sanda Klein, MD, [place] [phone] 06/03/2020, 8:03 PM

## 2020-06-03 NOTE — MAU Note (Signed)
Presents with c/o SOB and swelling in bilateral lower extremities.  States SOB began this morning @ 0300, states has slight cough.  Reports has had swelling prior to delivery but has worsened. S/P NVD 05/30/2020.

## 2020-06-03 NOTE — MAU Note (Signed)
Echocardiogram being performed at bedside.

## 2020-06-03 NOTE — Plan of Care (Signed)
  Problem: Education: Goal: Knowledge of General Education information will improve Description: Including pain rating scale, medication(s)/side effects and non-pharmacologic comfort measures Outcome: Completed/Met

## 2020-06-03 NOTE — Progress Notes (Signed)
  Echocardiogram 2D Echocardiogram has been performed.  Lynn Cardenas 06/03/2020, 6:38 PM

## 2020-06-03 NOTE — Discharge Instructions (Signed)
HEART FAILURE INSTRUCTIONS   1. Follow a low salt diet which means you need to consume less than 2 grams (2000mg ) of sodium per day.  Check the labels!  You'll be surprised to see how much sodium is in common foods and drinks.  DO NOT EAT foods high in salt, such as salted nuts, crackers, smoked fish, fast food, deli meats, salami, pepperoni, jerky, prosciutto, ham, bacon, soups, prepared frozen dinners.  DO NOT add salt to your food when cooking or at the table.  It is ok to use other seasonings and flavors as long as they are salt free.   2. Drink less than 8 cups (which is ~206ml or 2 liters) of fluid per day.  This is equivalent to ~ 4 bottles of water a day. 3. Weigh yourself every morning before breakfast and write it down in a log.  Bring in your record to every clinic visit.  Check for swelling in your feet, ankles, legs and stomach every morning.  Take an extra dose of your fluid pill once a day for worsening shortness of breath, swelling, or >3lb weight gain in 24 hours or >5lb weight gain in 1 week. 4. Control your blood pressure.  If you have a blood pressure cuff, record your blood pressure & heart rate daily. Bring the record to [facility] clinic with you.  Goal BP is <130/80 5. Control your blood sugars if you are diabetic 6. Control your cholesterol.  The goal LDL (bad cholesterol) is <70 7. Lose weight if you are overweight 8. Exercise as much as you are able to strengthen your heart 9. Take your medicine the way you are instructed. 10.  Bring ALL your medicines and medication list to [place] clinic. 11.  Get a yearly flu shot to reduce your risk of the flu 12.  If you are under age 16 you need the Pneumovax pneumonia vaccine as a one time shot to reduce the risk of pneumonia 13.  If you are 11 yrs or older you need the Prevnar pneumonia vaccine as a one time shot followed by a Pneumovax pneumonia vaccine > 1 year later as a one time shot to help reduce your risk of pneumonia    Call nurse if you have trouble paying for your medications, are running low on pills, having trouble with transportation or if you are gaining weight quickly, having significant swelling, trouble breathing, chest pain, dizziness or fainting spells.

## 2020-06-03 NOTE — MAU Provider Note (Signed)
History     CSN: [id number]  Arrival date and time: 06/03/20 1310   Event Date/Time   First Provider Initiated Contact with Patient 06/03/20 1405      Chief Complaint  Patient presents with  . Shortness of Breath  . Swelling   HPI Lynn Cardenas is a 30 y.o. G1P1001 who presents to MAU with chief complaints of shortness of breath, bilateral lower extremity swelling, and recurrent but persistent cough. She is s/p SVD on 05/30/2020.  Shortness of Breath This is a new problem, onset this morning around 0300. Intensifies when reclined. Patient denies weakness, syncope. She denies chest pain, palpitations. She denies personal history of cardiac concerns,  Bilateral Lower Extremity Swelling This is a new problem, onset during hospital admission and worsening significantly today. Patient endorses bilateral dependent edema which ascends to the level of her thighs. Minor improvement when she elevates her legs.  Cough This is a recurrent problem, onset during hospital admission. Patient denies mucus. She denies red-tinged or frothy sputum.  Patient is exclusively breastfeeding. She received prenatal care with [facility].  OB History    Gravida  1   Para  1   Term  1   Preterm      AB      Living  1     SAB      IAB      Ectopic      Multiple  0   Live Births  1           Past Medical History:  Diagnosis Date  . Fibroid   . 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 NO PAST SURGERIES      History reviewed. No pertinent family history.  Social History   Tobacco Use  . Smoking status: Never Smoker  . Smokeless tobacco: Never Used  Vaping Use  . Vaping Use: Never used  Substance Use Topics  . Alcohol use: Never  . Drug use: Never    Allergies:  Allergies  Allergen Reactions  . Penicillins     Medications Prior to Admission  Medication Sig Dispense Refill Last Dose  .  Ascorbic Acid (VITAMIN C) 100 MG CHEW Chew by mouth.   06/03/2020 at Unknown time  . Prenatal Vit-Fe Fumarate-FA (PRENATAL MULTIVITAMIN) TABS tablet Take 1 tablet by mouth daily at 12 noon.   06/03/2020 at Unknown time  . famotidine (PEPCID) 20 MG tablet Take 1 tablet (20 mg total) by mouth 2 (two) times daily for 365 doses. 60 tablet 1   . ibuprofen (ADVIL) 600 MG tablet Take 1 tablet (600 mg total) by mouth every 6 (six) hours. 30 tablet 0   . iron polysaccharides (NIFEREX) 150 MG capsule Take 1 capsule (150 mg total) by mouth daily. 30 capsule 2     Review of Systems  Constitutional: Positive for fatigue.  Respiratory: Positive for cough and shortness of breath. Negative for chest tightness.   Gastrointestinal: Negative for abdominal pain.  Musculoskeletal: Negative for back pain.  Neurological: Negative for dizziness, syncope, weakness and headaches.  All other systems reviewed and are negative.  Physical Exam   Blood pressure 129/80, pulse 93, temperature 97.9 F (36.6 C), temperature source Oral, resp. rate 18, height 5' (1.524 m), weight 58.3 kg, SpO2 100 %, currently breastfeeding.  Physical Exam  MAU Course  Procedures  --VSS, lungs CTAB --Concern for pulsatile venous flow noted on  doppler. Subsequent abnormal ECG. Consult initiated with Dr Debara Pickett, Cardiology. Advised ECHO with plan to attend patient at bedside if ECHO is abnormal --CNM at bedside at 1715 to discuss results and Cardiology plan of care. Lynn Cardenas at bedside and, per patient request, patient's mother included in conversation via Facetime. - Dr. Loletha Grayer, evening Cardiology attending inbound to bedside as of 1915  Orders Placed This Encounter  Procedures  . Resp Panel by RT-PCR (Flu A&B, Covid) Nasopharyngeal Swab  . DG Chest Port 1 View  . Urinalysis, Routine w reflex microscopic Urine, Clean Catch  . CBC  . Comprehensive metabolic panel  . Airborne and Contact precautions  . ED EKG  . ECHOCARDIOGRAM COMPLETE   . VAS [place] LOWER EXTREMITY VENOUS (DVT) (ONLY [facility] & [facility])   Patient Vitals for the past 24 hrs:  BP Temp Temp src Pulse Resp SpO2 Height Weight  06/03/20 1731 129/68 97.8 F (36.6 C) Axillary 86 15 100 % -- --  06/03/20 1358 129/80 -- -- 93 -- 100 % -- --  06/03/20 1333 118/80 97.9 F (36.6 C) Oral 95 18 97 % -- --  06/03/20 1327 -- -- -- -- -- -- 5' (1.524 m) 58.3 kg   Results for orders placed or performed during the hospital encounter of 06/03/20 (from the past 24 hour(s))  Urinalysis, Routine w reflex microscopic Urine, Clean Catch     Status: Abnormal   Collection Time: 06/03/20  1:35 PM  Result Value Ref Range   Color, Urine YELLOW YELLOW   APPearance CLEAR CLEAR   Specific Gravity, Urine 1.003 (L) 1.005 - 1.030   pH 7.0 5.0 - 8.0   Glucose, UA NEGATIVE NEGATIVE mg/dL   Hgb urine dipstick LARGE (A) NEGATIVE   Bilirubin Urine NEGATIVE NEGATIVE   Ketones, ur NEGATIVE NEGATIVE mg/dL   Protein, ur NEGATIVE NEGATIVE mg/dL   Nitrite NEGATIVE NEGATIVE   Leukocytes,Ua LARGE (A) NEGATIVE   RBC / HPF 0-5 0 - 5 RBC/hpf   WBC, UA 11-20 0 - 5 WBC/hpf   Bacteria, UA RARE (A) NONE SEEN   Squamous Epithelial / LPF 0-5 0 - 5   Non Squamous Epithelial 0-5 (A) NONE SEEN  Resp Panel by RT-PCR (Flu A&B, Covid) Nasopharyngeal Swab     Status: None   Collection Time: 06/03/20  1:45 PM   Specimen: Nasopharyngeal Swab; Nasopharyngeal(NP) swabs in vial transport medium  Result Value Ref Range   SARS Coronavirus 2 by RT PCR NEGATIVE NEGATIVE   Influenza A by PCR NEGATIVE NEGATIVE   Influenza B by PCR NEGATIVE NEGATIVE  CBC     Status: Abnormal   Collection Time: 06/03/20  2:44 PM  Result Value Ref Range   WBC 5.9 4.0 - 10.5 K/uL   RBC 2.86 (L) 3.87 - 5.11 MIL/uL   Hemoglobin 8.9 (L) 12.0 - 15.0 g/dL   HCT 26.5 (L) 36.0 - 46.0 %   MCV 92.7 80.0 - 100.0 fL   MCH 31.1 26.0 - 34.0 pg   MCHC 33.6 30.0 - 36.0 g/dL   RDW 13.9 11.5 - 15.5 %   Platelets 318 150 - 400 K/uL   nRBC 0.0 0.0 - 0.2 %    DG Chest Port 1 View  Result Date: 06/03/2020 CLINICAL DATA:  Shortness of breath.  Postpartum. EXAM: PORTABLE CHEST 1 VIEW COMPARISON:  None. FINDINGS: No significant airspace disease or lung consolidation. Prominent structures in left suprahilar region could be related to prominent vascular structures. Evidence for a small azygos lobe.  Heart size is upper limits of normal but likely accentuated by the AP portable technique. IMPRESSION: 1. Slightly prominent central vascular structures possibly related to recent pregnancy. 2. No focal airspace disease. Electronically Signed   By: Markus Daft M.D.   On: 06/03/2020 15:12   ECHOCARDIOGRAM COMPLETE  Result Date: 06/03/2020    ECHOCARDIOGRAM REPORT   Patient Name:   Lynn Cardenas Date of Exam: 06/03/2020 Medical Rec #:  [id number]               Height:       60.0 in Accession #:    [id number]              Weight:       128.6 lb Date of Birth:  1990-01-20               BSA:          1.547 m Patient Age:    30 years                BP:           129/68 mmHg Patient Gender: F                       HR:           86 bpm. Exam Location:  Inpatient Procedure: 2D Echo, Cardiac Doppler and Color Doppler Indications:    Other abnormalities of the heart  History:        Patient has no prior history of Echocardiogram examinations.                 Signs/Symptoms:Shortness of Breath. Patient four days                 post-partum. Orthopnea.  Sonographer:    Clayton Lefort RDCS (AE) Referring Phys: [id number] Darlina Rumpf  Sonographer Comments: Patient in Fowler's position due to orthopnea. IMPRESSIONS  1. 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 There is mid-cavity obliteration and a small volume of apical entrapment. There is no LV outflow obstruction. Left ventricular ejection fraction, by estimation, is  >75%. The left ventricle has hyperdynamic function. The left ventricle has no regional wall motion abnormalities.  There is severe asymmetric left ventricular hypertrophy of the apical segment. Left ventricular diastolic parameters are consistent with Grade II diastolic dysfunction (pseudonormalization). Elevated left atrial pressure.  2. Right ventricular systolic function is normal. The right ventricular size is normal. There is moderately elevated pulmonary artery systolic pressure.  3. Left atrial size was severely dilated.  4. The pericardial effusion is circumferential.  5. The mitral valve is normal in structure. Trivial mitral valve regurgitation.  6. The aortic valve is tricuspid. Aortic valve regurgitation is not visualized. No aortic stenosis is present.  7. There is prominent a wave reversal in the hepatic veins (elevated RV end-diastolic pressure). The inferior vena cava is dilated in size with <50% respiratory variability, suggesting right atrial pressure of 15 mmHg. 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 There is mid-cavity obliteration and a small volume of apical entrapment. There is no LV outflow obstruction. Left ventricular ejection fraction, by estimation, is >75%. The left ventricle has hyperdynamic function. The left ventricle has no regional wall motion abnormalities. The left ventricular internal cavity size was small. There is severe asymmetric left ventricular hypertrophy of the apical segment. Left ventricular diastolic parameters are consistent with  Grade II diastolic dysfunction (pseudonormalization). Elevated left atrial pressure. Right Ventricle: The right ventricular size is normal. No increase in right ventricular wall thickness. Right ventricular systolic function is normal. There is moderately elevated pulmonary artery systolic pressure. The tricuspid regurgitant velocity is 3.07 m/s, and with an assumed right atrial pressure of 15 mmHg, the estimated right ventricular systolic pressure is 58.5 mmHg. Left Atrium: Left  atrial size was severely dilated. Right Atrium: Right atrial size was normal in size. Pericardium: Trivial pericardial effusion is present. The pericardial effusion is circumferential. Mitral Valve: The mitral valve is normal in structure. Trivial mitral valve regurgitation. MV peak gradient, 2.4 mmHg. The mean mitral valve gradient is 1.0 mmHg. Tricuspid Valve: The tricuspid valve is normal in structure. Tricuspid valve regurgitation is trivial. Aortic Valve: The aortic valve is tricuspid. Aortic valve regurgitation is not visualized. No aortic stenosis is present. Aortic valve mean gradient measures 3.0 mmHg. Aortic valve peak gradient measures 8.2 mmHg. Aortic valve area, by VTI measures 1.87 cm. Pulmonic Valve: The pulmonic valve was normal in structure. Pulmonic valve regurgitation is not visualized. Aorta: The aortic root is normal in size and structure. Venous: There is prominent a wave reversal in the hepatic veins (elevated RV end-diastolic pressure). The inferior vena cava is dilated in size with less than 50% respiratory variability, suggesting right atrial pressure of 15 mmHg. IAS/Shunts: No atrial level shunt detected by color flow Doppler.  LEFT VENTRICLE PLAX 2D LVIDd:         3.70 cm  Diastology LVIDs:         2.70 cm  LV e' medial:    3.08 cm/s LV PW:         1.90 cm  LV E/e' medial:  15.1 LV IVS:        1.65 cm  LV e' lateral:   3.45 cm/s LVOT diam:     1.60 cm  LV E/e' lateral: 13.5 LV SV:         39 LV SV Index:   25 LVOT Area:     2.01 cm  RIGHT VENTRICLE             IVC RV Basal diam:  2.90 cm     IVC diam: 2.50 cm RV S prime:     10.60 cm/s TAPSE (M-mode): 1.7 cm LEFT ATRIUM              Index       RIGHT ATRIUM           Index LA diam:        4.56 cm  2.95 cm/m  RA Area:     20.60 cm LA Vol (A2C):   117.0 ml 75.62 ml/m RA Volume:   60.80 ml  39.30 ml/m LA Vol (A4C):   112.0 ml 72.39 ml/m LA Biplane Vol: 115.0 ml 74.33 ml/m  AORTIC VALVE AV Area (Vmax):    1.72 cm AV Area (Vmean):    1.77 cm AV Area (VTI):     1.87 cm AV Vmax:           143.00 cm/s AV Vmean:          82.400 cm/s AV VTI:            0.211 m AV Peak Grad:      8.2 mmHg AV Mean Grad:      3.0 mmHg LVOT Vmax:         122.00 cm/s LVOT Vmean:  72.500 cm/s LVOT VTI:          0.196 m LVOT/AV VTI ratio: 0.93  AORTA Ao Root diam: 3.10 cm Ao Asc diam:  2.50 cm MITRAL VALVE               TRICUSPID VALVE MV Area (PHT): 3.83 cm    TR Peak grad:   37.7 mmHg MV Area VTI:   2.77 cm    TR Vmax:        307.00 cm/s MV Peak grad:  2.4 mmHg MV Mean grad:  1.0 mmHg    SHUNTS MV Vmax:       0.78 m/s    Systemic VTI:  0.20 m MV Vmean:      41.8 cm/s   Systemic Diam: 1.60 cm MV Decel Time: 198 msec MV E velocity: 46.50 cm/s MV A velocity: 31.50 cm/s MV E/A ratio:  1.48 Lynn Croitoru MD Electronically signed by Sanda Klein MD Signature Date/Time: 06/03/2020/7:01:11 PM    Final    VAS [place] LOWER EXTREMITY VENOUS (DVT) (ONLY [facility] & [facility])  Result Date: 06/03/2020  Lower Venous DVT Study Patient Name:  Lynn Cardenas  Date of Exam:   06/03/2020 Medical Rec #: [id number]                Accession #:    [id number] Date of Birth: 08-25-1990                Patient Gender: F Patient Age:   18Y Exam Location:  [facility] Procedure:      VAS [place] LOWER EXTREMITY VENOUS (DVT) Referring Phys: [id number] Darlina Rumpf --------------------------------------------------------------------------------  Indications: 4 days post partum with new onset worsening pitting edema, shortness of breath, and slight cough.  Comparison Study: No prior study Performing Technologist: Maudry Mayhew MHA, RDMS, RVT, RDCS  Examination Guidelines: A complete evaluation includes B-mode imaging, spectral Doppler, color Doppler, and power Doppler as needed of all accessible portions of each vessel. Bilateral testing is considered an integral part of a complete examination. Limited examinations for reoccurring indications may be performed as noted. The reflux  portion of the exam is performed with the patient in reverse Trendelenburg.  +---------+---------------+---------+-----------+----------+--------------+ RIGHT    CompressibilityPhasicitySpontaneityPropertiesThrombus Aging +---------+---------------+---------+-----------+----------+--------------+ CFV      Full           No       Yes                                 +---------+---------------+---------+-----------+----------+--------------+ SFJ      Full                                                        +---------+---------------+---------+-----------+----------+--------------+ FV Prox  Full                                                        +---------+---------------+---------+-----------+----------+--------------+ FV Mid   Full                                                        +---------+---------------+---------+-----------+----------+--------------+  FV DistalFull                                                        +---------+---------------+---------+-----------+----------+--------------+ PFV      Full                                                        +---------+---------------+---------+-----------+----------+--------------+ POP      Full           No       Yes                                 +---------+---------------+---------+-----------+----------+--------------+ PTV      Full                                                        +---------+---------------+---------+-----------+----------+--------------+ PERO     Full                                                        +---------+---------------+---------+-----------+----------+--------------+   +---------+---------------+---------+-----------+----------+--------------+ LEFT     CompressibilityPhasicitySpontaneityPropertiesThrombus Aging +---------+---------------+---------+-----------+----------+--------------+ CFV      Full           No       Yes                                  +---------+---------------+---------+-----------+----------+--------------+ SFJ      Full                                                        +---------+---------------+---------+-----------+----------+--------------+ FV Prox  Full                                                        +---------+---------------+---------+-----------+----------+--------------+ FV Mid   Full                                                        +---------+---------------+---------+-----------+----------+--------------+ FV DistalFull                                                        +---------+---------------+---------+-----------+----------+--------------+  PFV      Full                                                        +---------+---------------+---------+-----------+----------+--------------+ POP      Full           No       Yes                                 +---------+---------------+---------+-----------+----------+--------------+ PTV      Full                                                        +---------+---------------+---------+-----------+----------+--------------+ PERO     Full                                                        +---------+---------------+---------+-----------+----------+--------------+    Summary: RIGHT: - There is no evidence of deep vein thrombosis in the lower extremity.  - No cystic structure found in the popliteal fossa.  LEFT: - There is no evidence of deep vein thrombosis in the lower extremity.  - No cystic structure found in the popliteal fossa.  Pulsatile lower extremity venous flow is suggestive of possibly elevated right-heart pressure.  *See table(s) above for measurements and observations.    Preliminary    Assessment and Plan  --30 y.o. G1P1001 s/p vaginal delivery 05/30/2020 --Hypertrophic Cardiomyopathy --S/p bedside assessment by Cardiology --S/p Feraheme and Lasix in  MAU --Discussed with Drs Kennon Rounds and Alesia Richards --Patient to be admitted to [facility] to allow newborn presence, facilitate breastfeeding --Per Dr. Alesia Richards, admit to [place], MSN, CNM Certified Nurse Midwife, [facility] for [facility], [place] 06/03/20 8:24 PM

## 2020-06-03 NOTE — Progress Notes (Signed)
Bilateral lower extremity venous duplex completed. Refer to "CV Proc" under chart review to view preliminary results.  06/03/2020 4:50 PM Kelby Aline., MHA, RVT, RDCS, RDMS

## 2020-06-04 DIAGNOSIS — I425 Other restrictive cardiomyopathy: Secondary | ICD-10-CM | POA: Diagnosis not present

## 2020-06-04 DIAGNOSIS — R600 Localized edema: Secondary | ICD-10-CM

## 2020-06-04 DIAGNOSIS — E8809 Other disorders of plasma-protein metabolism, not elsewhere classified: Secondary | ICD-10-CM

## 2020-06-04 DIAGNOSIS — E871 Hypo-osmolality and hyponatremia: Secondary | ICD-10-CM

## 2020-06-04 LAB — COMPREHENSIVE METABOLIC PANEL
ALT: 33 U/L (ref 0–44)
AST: 52 U/L — ABNORMAL HIGH (ref 15–41)
Albumin: 2.4 g/dL — ABNORMAL LOW (ref 3.5–5.0)
Alkaline Phosphatase: 98 U/L (ref 38–126)
Anion gap: 12 (ref 5–15)
BUN: 5 mg/dL — ABNORMAL LOW (ref 6–20)
CO2: 18 mmol/L — ABNORMAL LOW (ref 22–32)
Calcium: 8.3 mg/dL — ABNORMAL LOW (ref 8.9–10.3)
Chloride: 94 mmol/L — ABNORMAL LOW (ref 98–111)
Creatinine, Ser: 0.64 mg/dL (ref 0.44–1.00)
GFR, Estimated: >60 mL/min (ref >60)
Glucose, Bld: 68 mg/dL — ABNORMAL LOW (ref 70–99)
Potassium: 3.8 mmol/L (ref 3.5–5.1)
Sodium: 124 mmol/L — ABNORMAL LOW (ref 135–145)
Total Bilirubin: 1.2 mg/dL (ref 0.3–1.2)
Total Protein: 4.9 g/dL — ABNORMAL LOW (ref 6.5–8.1)

## 2020-06-04 LAB — CBC
HCT: 26.3 % — ABNORMAL LOW (ref 36.0–46.0)
HCT: 25 % — ABNORMAL LOW (ref 36.0–46.0)
Hemoglobin: 8.7 g/dL — ABNORMAL LOW (ref 12.0–15.0)
Hemoglobin: 8.6 g/dL — ABNORMAL LOW (ref 12.0–15.0)
MCH: 30.9 pg (ref 26.0–34.0)
MCH: 31.3 pg (ref 26.0–34.0)
MCHC: 33.1 g/dL (ref 30.0–36.0)
MCHC: 34.4 g/dL (ref 30.0–36.0)
MCV: 93.3 fL (ref 80.0–100.0)
MCV: 90.9 fL (ref 80.0–100.0)
Platelets: 295 10*3/uL (ref 150–400)
Platelets: 290 10*3/uL (ref 150–400)
RBC: 2.82 MIL/uL — ABNORMAL LOW (ref 3.87–5.11)
RBC: 2.75 MIL/uL — ABNORMAL LOW (ref 3.87–5.11)
RDW: 13.8 % (ref 11.5–15.5)
RDW: 13.4 % (ref 11.5–15.5)
WBC: 6.2 10*3/uL (ref 4.0–10.5)
WBC: 6.4 10*3/uL (ref 4.0–10.5)
nRBC: 0 % (ref 0.0–0.2)
nRBC: 0 % (ref 0.0–0.2)

## 2020-06-04 LAB — BASIC METABOLIC PANEL
Anion gap: 10 (ref 5–15)
Anion gap: 10 (ref 5–15)
BUN: 6 mg/dL (ref 6–20)
BUN: 6 mg/dL (ref 6–20)
CO2: 17 mmol/L — ABNORMAL LOW (ref 22–32)
CO2: 22 mmol/L (ref 22–32)
Calcium: 8.2 mg/dL — ABNORMAL LOW (ref 8.9–10.3)
Calcium: 8.6 mg/dL — ABNORMAL LOW (ref 8.9–10.3)
Chloride: 95 mmol/L — ABNORMAL LOW (ref 98–111)
Chloride: 95 mmol/L — ABNORMAL LOW (ref 98–111)
Creatinine, Ser: 0.54 mg/dL (ref 0.44–1.00)
Creatinine, Ser: 0.48 mg/dL (ref 0.44–1.00)
GFR, Estimated: >60 mL/min (ref >60)
GFR, Estimated: >60 mL/min (ref >60)
Glucose, Bld: 84 mg/dL (ref 70–99)
Glucose, Bld: 87 mg/dL (ref 70–99)
Potassium: 3.9 mmol/L (ref 3.5–5.1)
Potassium: 3.7 mmol/L (ref 3.5–5.1)
Sodium: 122 mmol/L — ABNORMAL LOW (ref 135–145)
Sodium: 127 mmol/L — ABNORMAL LOW (ref 135–145)

## 2020-06-04 LAB — TSH: TSH: 1.605 u[IU]/mL (ref 0.350–4.500)

## 2020-06-04 LAB — T4, FREE: Free T4: 1.88 ng/dL — ABNORMAL HIGH (ref 0.61–1.12)

## 2020-06-04 NOTE — Progress Notes (Signed)
PPD# 5 from SVD on 05/30/20  S:   Reports feeling better, less anxious, denies SOB, and palpitations.. Tolerating PO fluid and solids. Denies N/V. Postpartum bleeding is light and there are no clots. Baby girl is in the room, mom continues breast pumping, Spouse and pt's father present and supportive. Plan of care to be developed by cardiology and supported by OB  O:   VS: BP 114/72 (BP Location: Right Arm)   Pulse 93   Temp 97.6 F (36.4 C) (Oral)   Resp 16   Ht 5' (1.524 m)   Wt 58.3 kg   SpO2 100%   Breastfeeding Yes   BMI 25.10 kg/m   LABS:  Recent Labs    06/04/20 0451 06/04/20 0833  WBC 6.2 6.4  HGB 8.7* 8.6*  PLT 295 290   BMP Latest Ref Rng & Units 06/04/2020 06/04/2020 06/03/2020  Glucose 70 - 99 mg/dL 84 68(L) 66(L)  BUN 6 - 20 mg/dL 6 5(L) 5(L)  Creatinine 0.44 - 1.00 mg/dL 0.54 0.64 0.57  Sodium 135 - 145 mmol/L 122(L) 124(L) 133(L)  Potassium 3.5 - 5.1 mmol/L 3.9 3.8 4.0  Chloride 98 - 111 mmol/L 95(L) 94(L) 103  CO2 22 - 32 mmol/L 17(L) 18(L) 22  Calcium 8.9 - 10.3 mg/dL 8.2(L) 8.3(L) 9.1    Blood type: --/--/O POS (05/23 1618) Rubella: Immune (10/19 0000)                      I&O: Intake/Output      05/27 0701 05/28 0700 05/28 0701 05/29 0700   P.O. 960 270   Total Intake(mL/kg) 960 (16.5) 270 (4.6)   Urine (mL/kg/hr) 1625 3150 (10.3)   Emesis/NG output 0    Stool 0    Total Output 1625 3150   Net -665 -2880        Breastfed  1 x     Physical Exam: Alert and oriented X3 Lungs: Unlabored Heart: regular rate and rhythm Abdomen: soft, non-tender, non-distended  Fundus: firm, non-tender, U-3 Perineum: non-edematous Lochia: appropriate Extremities: 2+edema, no calf pain, tenderness, or cords    A:  PPD #5 S/P NSVD  Normal postpartum exam Postpartum hypertrophic cardiomyopathy  P:  Continue cardiology recommendations for diuretics, daily weights, strict I/O Cardiology to see today and make recommendations for follow-up care Lactation  support PRN  Plan reviewed w/ Dr. Noralee Stain, MSN, CNM

## 2020-06-04 NOTE — Lactation Note (Signed)
Lactation Consultation Note  Patient Name: Lynn Cardenas [id number] Date: 06/04/2020 Reason for consult: Initial assessment;Term;1st time breastfeeding;[place] Age:30 y.o.   LC in to visit with P1 readmit PPD #4 for SOB and bilateral lower extremity swelling.  Mom is on Lasix.  Mom complaining of soreness with latching.   DEBP set up and Mom pumped this am and expressed 30 ml.  Breasts filling, but not engorged.  Hand's free pumping band provided due to Mom not feeling well.  Mom encouraged to offer breast with cues asking for help with latching baby.     Baby started waking and offered to assist with positioning and latching.  Baby latched with little effort in cross cradle hold on left breast.  No signs of nipple trauma noted and not discomfort voiced from Mom.  Baby noted to be relaxed with regular swallowing noted.  Mom had to take baby off the breast after 5 mins to use bathroom.   FOB feeding baby 30 ml EBM by paced bottle.   Pump parts disassembled, washed, rinsed and placed in separate bin for drying, demonstrating this for FOB and Mom.   Mom encouraged to pump whenever baby is being fed other than at the breast to support her milk supply.   Mom to ask for help with feeding baby or pumping.  Mom to use maintenance setting on pump due to increased volume expressed.  Feeding Mother's Current Feeding Choice: Breast Milk  LATCH Score Latch: Grasps breast easily, tongue down, lips flanged, rhythmical sucking.  Audible Swallowing: Spontaneous and intermittent  Type of Nipple: Everted at rest and after stimulation  Comfort (Breast/Nipple): Soft / non-tender  Hold (Positioning): Assistance needed to correctly position infant at breast and maintain latch.  LATCH Score: 9   Lactation Tools Discussed/Used Tools: Pump;Flanges;Bottle Flange Size: 24 Breast pump type: Double-Electric Breast Pump Pump Education: Setup, frequency, and cleaning;Milk Storage Reason for  Pumping: Support milk supply Pumping frequency: Q 3hr Pumped volume: 30 mL  Interventions Interventions: Assisted with latch;Skin to skin;Breast massage;Hand express;Breast compression;Adjust position;Support pillows;Position options;Hand pump;DEBP;Education  Consult Status Consult Status: Follow-up Date: 06/05/20 Follow-up type: [place] 06/04/2020, 9:47 AM

## 2020-06-04 NOTE — Progress Notes (Signed)
Progress Note  Patient Name: Lynn Cardenas Date of Encounter: 06/04/2020  [facility] [address] [facility] Cardiologist: None   Subjective   Seen while breastfeeding. Comfortable on room air, occasionally feels short of breath but when checked, O2 levels are normal. Confirmed no clear family history of heart failure, no history of sudden cardiac death. No symptoms until near the end of pregnancy, then noted more leg swelling. Has been a vegan for about 5 years. Has not had much appetite/PO intake over the last day. Has been making good urine.  Inpatient Medications    Scheduled Meds: . prenatal multivitamin  1 tablet Oral Q1200   Continuous Infusions:  PRN Meds: acetaminophen, calcium carbonate   Vital Signs    Vitals:   06/04/20 0002 06/04/20 0353 06/04/20 0806 06/04/20 1154  BP: 123/77 124/70 130/78 114/72  Pulse: 98 98 100 93  Resp: 16 16 16 16   Temp: 97.9 F (36.6 C) 97.7 F (36.5 C) 97.8 F (36.6 C) 97.6 F (36.4 C)  TempSrc: Oral Oral Oral Oral  SpO2: 98% 100% 100% 100%  Weight:  58.3 kg    Height:        Intake/Output Summary (Last 24 hours) at 06/04/2020 1335 Last data filed at 06/04/2020 1135 Gross per 24 hour  Intake 1230 ml  Output 4775 ml  Net -3545 ml   Last 3 Weights 06/04/2020 06/03/2020 05/30/2020  Weight (lbs) 128 lb 8 oz 128 lb 9.6 oz 138 lb  Weight (kg) 58.287 kg 58.333 kg 62.596 kg      Telemetry     not on - Personally Reviewed  ECG    06/03/20 NSR, LAE, LVH - Personally Reviewed  Physical Exam   GEN: No acute distress.   Neck: No JVD sitting upright Cardiac: RRR, no murmurs, rubs. +S3.  Respiratory: Clear to auscultation bilaterally. GI: Soft, nontender, non-distended  MS: bilateral 2+ pitting edema; No deformity. Neuro:  Nonfocal  Psych: Normal affect   Labs    High Sensitivity Troponin:  No results for input(s): TROPONINIHS in the last 720 hours.    Chemistry Recent Labs  Lab 06/03/20 1600 06/04/20 0451 06/04/20 0833  NA  133* 124* 122*  K 4.0 3.8 3.9  CL 103 94* 95*  CO2 22 18* 17*  GLUCOSE 66* 68* 84  BUN 5* 5* 6  CREATININE 0.57 0.64 0.54  CALCIUM 9.1 8.3* 8.2*  PROT 5.9* 4.9*  --   ALBUMIN 2.8* 2.4*  --   AST 62* 52*  --   ALT 40 33  --   ALKPHOS 119 98  --   BILITOT 1.4* 1.2  --   GFRNONAA >60 >60 >60  ANIONGAP 8 12 10      Hematology Recent Labs  Lab 06/03/20 1444 06/04/20 0451 06/04/20 0833  WBC 5.9 6.2 6.4  RBC 2.86* 2.82* 2.75*  HGB 8.9* 8.7* 8.6*  HCT 26.5* 26.3* 25.0*  MCV 92.7 93.3 90.9  MCH 31.1 30.9 31.3  MCHC 33.6 33.1 34.4  RDW 13.9 13.8 13.4  PLT 318 295 290    BNP Recent Labs  Lab 06/03/20 1736  BNP 2,642.8*     DDimer No results for input(s): DDIMER in the last 168 hours.   Radiology    DG Chest Port 1 View  Result Date: 06/03/2020 CLINICAL DATA:  Shortness of breath.  Postpartum. EXAM: PORTABLE CHEST 1 VIEW COMPARISON:  None. FINDINGS: No significant airspace disease or lung consolidation. Prominent structures in left suprahilar region could be related to prominent vascular  structures. Evidence for a small azygos lobe. Heart size is upper limits of normal but likely accentuated by the AP portable technique. IMPRESSION: 1. Slightly prominent central vascular structures possibly related to recent pregnancy. 2. No focal airspace disease. Electronically Signed   By: Markus Daft M.D.   On: 06/03/2020 15:12   ECHOCARDIOGRAM COMPLETE  Result Date: 06/03/2020    ECHOCARDIOGRAM REPORT   Patient Name:   Lynn Cardenas Date of Exam: 06/03/2020 Medical Rec #:  [id number]               Height:       60.0 in Accession #:    [id number]              Weight:       128.6 lb Date of Birth:  16-Sep-1990               BSA:          1.547 m Patient Age:    30 years                BP:           129/68 mmHg Patient Gender: F                       HR:           86 bpm. Exam Location:  Inpatient Procedure: 2D Echo, Cardiac Doppler and Color Doppler Indications:    Other abnormalities  of the heart  History:        Patient has no prior history of Echocardiogram examinations.                 Signs/Symptoms:Shortness of Breath. Patient four days                 post-partum. Orthopnea.  Sonographer:    Clayton Lefort RDCS (AE) Referring Phys: [id number] Darlina Rumpf  Sonographer Comments: Patient in Fowler's position due to orthopnea. IMPRESSIONS  1. 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 There is mid-cavity obliteration and a small volume of apical entrapment. There is no LV outflow obstruction. Left ventricular ejection fraction, by estimation, is  >75%. The left ventricle has hyperdynamic function. The left ventricle has no regional wall motion abnormalities. There is severe asymmetric left ventricular hypertrophy of the apical segment. Left ventricular diastolic parameters are consistent with Grade II diastolic dysfunction (pseudonormalization). Elevated left atrial pressure.  2. Right ventricular systolic function is normal. The right ventricular size is normal. There is moderately elevated pulmonary artery systolic pressure.  3. Left atrial size was severely dilated.  4. The pericardial effusion is circumferential.  5. The mitral valve is normal in structure. Trivial mitral valve regurgitation.  6. The aortic valve is tricuspid. Aortic valve regurgitation is not visualized. No aortic stenosis is present.  7. There is prominent a wave reversal in the hepatic veins (elevated RV end-diastolic pressure). The inferior vena cava is dilated in size with <50% respiratory variability, suggesting right atrial pressure of 15 mmHg. 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 There is mid-cavity obliteration and a small volume of apical entrapment. There is no LV outflow obstruction. Left ventricular ejection fraction, by estimation, is >75%. The left ventricle has hyperdynamic function. The  left ventricle has no regional wall motion abnormalities. The left ventricular internal cavity size was small. There is severe asymmetric left ventricular hypertrophy of the apical  segment. Left ventricular diastolic parameters are consistent with Grade II diastolic dysfunction (pseudonormalization). Elevated left atrial pressure. Right Ventricle: The right ventricular size is normal. No increase in right ventricular wall thickness. Right ventricular systolic function is normal. There is moderately elevated pulmonary artery systolic pressure. The tricuspid regurgitant velocity is 3.07 m/s, and with an assumed right atrial pressure of 15 mmHg, the estimated right ventricular systolic pressure is 99.3 mmHg. Left Atrium: Left atrial size was severely dilated. Right Atrium: Right atrial size was normal in size. Pericardium: Trivial pericardial effusion is present. The pericardial effusion is circumferential. Mitral Valve: The mitral valve is normal in structure. Trivial mitral valve regurgitation. MV peak gradient, 2.4 mmHg. The mean mitral valve gradient is 1.0 mmHg. Tricuspid Valve: The tricuspid valve is normal in structure. Tricuspid valve regurgitation is trivial. Aortic Valve: The aortic valve is tricuspid. Aortic valve regurgitation is not visualized. No aortic stenosis is present. Aortic valve mean gradient measures 3.0 mmHg. Aortic valve peak gradient measures 8.2 mmHg. Aortic valve area, by VTI measures 1.87 cm. Pulmonic Valve: The pulmonic valve was normal in structure. Pulmonic valve regurgitation is not visualized. Aorta: The aortic root is normal in size and structure. Venous: There is prominent a wave reversal in the hepatic veins (elevated RV end-diastolic pressure). The inferior vena cava is dilated in size with less than 50% respiratory variability, suggesting right atrial pressure of 15 mmHg. IAS/Shunts: No atrial level shunt detected by color flow Doppler.  LEFT VENTRICLE PLAX 2D LVIDd:         3.70  cm  Diastology LVIDs:         2.70 cm  LV e' medial:    3.08 cm/s LV PW:         1.90 cm  LV E/e' medial:  15.1 LV IVS:        1.65 cm  LV e' lateral:   3.45 cm/s LVOT diam:     1.60 cm  LV E/e' lateral: 13.5 LV SV:         39 LV SV Index:   25 LVOT Area:     2.01 cm  RIGHT VENTRICLE             IVC RV Basal diam:  2.90 cm     IVC diam: 2.50 cm RV S prime:     10.60 cm/s TAPSE (M-mode): 1.7 cm LEFT ATRIUM              Index       RIGHT ATRIUM           Index LA diam:        4.56 cm  2.95 cm/m  RA Area:     20.60 cm LA Vol (A2C):   117.0 ml 75.62 ml/m RA Volume:   60.80 ml  39.30 ml/m LA Vol (A4C):   112.0 ml 72.39 ml/m LA Biplane Vol: 115.0 ml 74.33 ml/m  AORTIC VALVE AV Area (Vmax):    1.72 cm AV Area (Vmean):   1.77 cm AV Area (VTI):     1.87 cm AV Vmax:           143.00 cm/s AV Vmean:          82.400 cm/s AV VTI:            0.211 m AV Peak Grad:      8.2 mmHg AV Mean Grad:      3.0 mmHg LVOT Vmax:         122.00  cm/s LVOT Vmean:        72.500 cm/s LVOT VTI:          0.196 m LVOT/AV VTI ratio: 0.93  AORTA Ao Root diam: 3.10 cm Ao Asc diam:  2.50 cm MITRAL VALVE               TRICUSPID VALVE MV Area (PHT): 3.83 cm    TR Peak grad:   37.7 mmHg MV Area VTI:   2.77 cm    TR Vmax:        307.00 cm/s MV Peak grad:  2.4 mmHg MV Mean grad:  1.0 mmHg    SHUNTS MV Vmax:       0.78 m/s    Systemic VTI:  0.20 m MV Vmean:      41.8 cm/s   Systemic Diam: 1.60 cm MV Decel Time: 198 msec MV E velocity: 46.50 cm/s MV A velocity: 31.50 cm/s MV E/A ratio:  1.48 Mihai Croitoru MD Electronically signed by Sanda Klein MD Signature Date/Time: 06/03/2020/7:01:11 PM    Final    VAS [place] LOWER EXTREMITY VENOUS (DVT) (ONLY [facility] & [facility])  Result Date: 06/04/2020  Lower Venous DVT Study Patient Name:  TYTIANNA GREENLEY  Date of Exam:   06/03/2020 Medical Rec #: [id number]                Accession #:    [id number] Date of Birth: 1990-10-30                Patient Gender: F Patient Age:   89Y Exam Location:  [facility]  Procedure:      VAS [place] LOWER EXTREMITY VENOUS (DVT) Referring Phys: [id number] Darlina Rumpf --------------------------------------------------------------------------------  Indications: 4 days post partum with new onset worsening pitting edema, shortness of breath, and slight cough.  Comparison Study: No prior study Performing Technologist: Maudry Mayhew MHA, RDMS, RVT, RDCS  Examination Guidelines: A complete evaluation includes B-mode imaging, spectral Doppler, color Doppler, and power Doppler as needed of all accessible portions of each vessel. Bilateral testing is considered an integral part of a complete examination. Limited examinations for reoccurring indications may be performed as noted. The reflux portion of the exam is performed with the patient in reverse Trendelenburg.  +---------+---------------+---------+-----------+----------+--------------+ RIGHT    CompressibilityPhasicitySpontaneityPropertiesThrombus Aging +---------+---------------+---------+-----------+----------+--------------+ CFV      Full           No       Yes                                 +---------+---------------+---------+-----------+----------+--------------+ SFJ      Full                                                        +---------+---------------+---------+-----------+----------+--------------+ FV Prox  Full                                                        +---------+---------------+---------+-----------+----------+--------------+ FV Mid   Full                                                        +---------+---------------+---------+-----------+----------+--------------+  FV DistalFull                                                        +---------+---------------+---------+-----------+----------+--------------+ PFV      Full                                                        +---------+---------------+---------+-----------+----------+--------------+ POP       Full           No       Yes                                 +---------+---------------+---------+-----------+----------+--------------+ PTV      Full                                                        +---------+---------------+---------+-----------+----------+--------------+ PERO     Full                                                        +---------+---------------+---------+-----------+----------+--------------+   +---------+---------------+---------+-----------+----------+--------------+ LEFT     CompressibilityPhasicitySpontaneityPropertiesThrombus Aging +---------+---------------+---------+-----------+----------+--------------+ CFV      Full           No       Yes                                 +---------+---------------+---------+-----------+----------+--------------+ SFJ      Full                                                        +---------+---------------+---------+-----------+----------+--------------+ FV Prox  Full                                                        +---------+---------------+---------+-----------+----------+--------------+ FV Mid   Full                                                        +---------+---------------+---------+-----------+----------+--------------+ FV DistalFull                                                        +---------+---------------+---------+-----------+----------+--------------+  PFV      Full                                                        +---------+---------------+---------+-----------+----------+--------------+ POP      Full           No       Yes                                 +---------+---------------+---------+-----------+----------+--------------+ PTV      Full                                                        +---------+---------------+---------+-----------+----------+--------------+ PERO     Full                                                         +---------+---------------+---------+-----------+----------+--------------+     Summary: RIGHT: - There is no evidence of deep vein thrombosis in the lower extremity.  - No cystic structure found in the popliteal fossa.  LEFT: - There is no evidence of deep vein thrombosis in the lower extremity.  - No cystic structure found in the popliteal fossa.  Pulsatile lower extremity venous flow is suggestive of possibly elevated right-heart pressure.  *See table(s) above for measurements and observations. Electronically signed by Monica Martinez MD on 06/04/2020 at 11:00:21 AM.    Final     Cardiac Studies   Echo 06/03/20 1. The overall appearance is consistent with hypertrophic cardiomyopathy,  apical variant. There is mid-cavity obliteration and a small volume of  apical entrapment. There is no LV outflow obstruction. Left ventricular  ejection fraction, by estimation, is  >75%. The left ventricle has hyperdynamic function. The left ventricle  has no regional wall motion abnormalities. There is severe asymmetric left  ventricular hypertrophy of the apical segment. Left ventricular diastolic  parameters are consistent with  Grade II diastolic dysfunction (pseudonormalization). Elevated left atrial  pressure.  2. Right ventricular systolic function is normal. The right ventricular  size is normal. There is moderately elevated pulmonary artery systolic  pressure.  3. Left atrial size was severely dilated.  4. The pericardial effusion is circumferential.  5. The mitral valve is normal in structure. Trivial mitral valve  regurgitation.  6. The aortic valve is tricuspid. Aortic valve regurgitation is not  visualized. No aortic stenosis is present.  7. There is prominent a wave reversal in the hepatic veins (elevated RV  end-diastolic pressure). The inferior vena cava is dilated in size with  <50% respiratory variability, suggesting right atrial pressure of 15 mmHg.    Patient Profile     30 y.o. female with recent SVD of her first child, presenting with shortness of breath, orthopnea, and edema. 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 Likely contributing to third spacing -Cr 0.57 on admission, 0.64 today -K 3.8 -BNP 2642 -On my personal review of images, there is severe LVH without LVOT obstruction. Given young age, question infiltrative disease. There are severely reduced tissue doppler readings, with medial and lateral E' around 3. RV appears thickened as well.  -I also reviewed her CXR, largely unimpressive in terms of edema.  -this may all be apical HCM, but I am concerned that there may be another process as well, such as infiltrative disease.  -she has already received feraheme this admission, unclear whether checking iron studies after infusion would have utility. -would like to get cardiac MRI for further evaluation. May also need genetic testing. No clear history of storage diseases in the family (Fabry, etc) but cannot be excluded.  Anemia: -Hgb 8.7, was 9.5 yesterday. rechecking -Hgb 11 on admission for delivery  Hyponatremia: -sodium only 124 this AM, 133 yesterday--rechecking BMET-->sodium 122 -would not be expected with the use of only a loop diuretic (not thiazide) -holding lasix. Encouraged salt intake. No neurologic symptoms.  -recheck BMET  Overall the HCM may be contributing to some of her symptoms, but there are additional concerning findings that do not appear related (low albumin, worsening anemia, now hyponatremia with only low dose diuretics). Rechecking labs, but if this is consistent, may need further workup. Urine protein not consistent with  nephrotic range. Adding thyroid studies today. Her urine is dilute based on specific gravity. If recheck BMET is accurate in terms of the sodium level, may need urine electrolyte studies for further evaluation.   For questions or updates, please contact [place] Please consult [email or web address] for contact info under        Signed, Buford Dresser, MD  06/04/2020, 1:35 PM

## 2020-06-05 DIAGNOSIS — E871 Hypo-osmolality and hyponatremia: Secondary | ICD-10-CM | POA: Diagnosis not present

## 2020-06-05 DIAGNOSIS — D509 Iron deficiency anemia, unspecified: Secondary | ICD-10-CM | POA: Diagnosis present

## 2020-06-05 DIAGNOSIS — E876 Hypokalemia: Secondary | ICD-10-CM | POA: Diagnosis present

## 2020-06-05 DIAGNOSIS — I425 Other restrictive cardiomyopathy: Secondary | ICD-10-CM | POA: Diagnosis not present

## 2020-06-05 LAB — CBC
HCT: 25.3 % — ABNORMAL LOW (ref 36.0–46.0)
Hemoglobin: 8.7 g/dL — ABNORMAL LOW (ref 12.0–15.0)
MCH: 31 pg (ref 26.0–34.0)
MCHC: 34.4 g/dL (ref 30.0–36.0)
MCV: 90 fL (ref 80.0–100.0)
Platelets: 292 10*3/uL (ref 150–400)
RBC: 2.81 MIL/uL — ABNORMAL LOW (ref 3.87–5.11)
RDW: 13.2 % (ref 11.5–15.5)
WBC: 5.4 10*3/uL (ref 4.0–10.5)
nRBC: 0.4 % — ABNORMAL HIGH (ref 0.0–0.2)

## 2020-06-05 LAB — BASIC METABOLIC PANEL
Anion gap: 8 (ref 5–15)
Anion gap: 6 (ref 5–15)
BUN: 7 mg/dL (ref 6–20)
BUN: 5 mg/dL — ABNORMAL LOW (ref 6–20)
CO2: 22 mmol/L (ref 22–32)
CO2: 24 mmol/L (ref 22–32)
Calcium: 8.2 mg/dL — ABNORMAL LOW (ref 8.9–10.3)
Calcium: 8.5 mg/dL — ABNORMAL LOW (ref 8.9–10.3)
Chloride: 94 mmol/L — ABNORMAL LOW (ref 98–111)
Chloride: 98 mmol/L (ref 98–111)
Creatinine, Ser: 0.6 mg/dL (ref 0.44–1.00)
Creatinine, Ser: 0.59 mg/dL (ref 0.44–1.00)
GFR, Estimated: >60 mL/min (ref >60)
GFR, Estimated: >60 mL/min (ref >60)
Glucose, Bld: 101 mg/dL — ABNORMAL HIGH (ref 70–99)
Glucose, Bld: 103 mg/dL — ABNORMAL HIGH (ref 70–99)
Potassium: 3.3 mmol/L — ABNORMAL LOW (ref 3.5–5.1)
Potassium: 3.8 mmol/L (ref 3.5–5.1)
Sodium: 124 mmol/L — ABNORMAL LOW (ref 135–145)
Sodium: 128 mmol/L — ABNORMAL LOW (ref 135–145)

## 2020-06-05 LAB — T3, FREE: T3, Free: 2.8 pg/mL (ref 2.0–4.4)

## 2020-06-05 MED ORDER — POTASSIUM CHLORIDE CRYS ER 20 MEQ PO TBCR
20.0000 meq | EXTENDED_RELEASE_TABLET | Freq: Two times a day (BID) | ORAL | Status: DC
  Administered 2020-06-05: 20 meq via ORAL
  Filled 2020-06-05: qty 1

## 2020-06-05 NOTE — Discharge Summary (Signed)
GYN OB Discharge Summary     Patient Name: Lynn Cardenas DOB: [birth year] MRN: [id number]  Date of admission: 06/03/2020 Delivering MD: This patient has no babies on file.  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 is a 30 y.o. female, G1P1001, who presents at Unknown weeks gestation. The patient has been followed at  [facility]  Her pregnancy has been complicated by:  Patient Active Problem List   Diagnosis Date Noted  . Hypokalemia 06/05/2020  . Iron deficiency anemia 06/05/2020  . Hyponatremia 06/05/2020  . Postpartum cardiomyopathy 06/03/2020  . Cardiomyopathy ([place]) 06/03/2020  . Acute blood loss anemia 06/01/2020  . PROM (premature rupture of membranes) 06/01/2020  . Normal postpartum course 06/01/2020  . SVD (5/23) 05/31/2020  . 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 Normal labor 05/30/2020  . 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 Anemia of pregnancy 05/30/2020  . Uterine fibroids affecting pregnancy 05/30/2020     Active Ambulatory Problems    Diagnosis Date Noted  . 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 Normal labor 05/30/2020  . 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 Anemia of pregnancy 05/30/2020  . Uterine fibroids affecting pregnancy 05/30/2020  . SVD (5/23) 05/31/2020  . Acute blood loss anemia 06/01/2020  . PROM (premature rupture of membranes) 06/01/2020  . Normal postpartum course 06/01/2020   Resolved Ambulatory Problems    Diagnosis Date Noted  . No Resolved Ambulatory Problems   Past Medical History:  Diagnosis Date  . Fibroid   . Herpes genitalis in women      Hospital course:   Pt admitted on   5/27: Lynn Cardenas is an 30 y.o. female G1P1001 PPD # 4 after a normal spontaneous vaginal delivery who presented complaining of shortness of breath since delivery that worsens when lying down.  She also reports bilateral lower extremity swelling since 36 weeks of pregnancy that has worsened recently.  She has had intermittent cough.  She reports light vaginal bleeding.  She denies abdominal pain.  She is only breastfeeding.  S/P IV lasix, IV venofer for anemia.   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 Likely contributing to third spacing -Cr 0.57 on admission, 0.64 today -K 3.8 Hypokalemia resolved with PO Potassium. -BNP 2642 -On my personal review of images, there is severe LVH without LVOT obstruction. Given young age, question infiltrative disease. There are severely reduced tissue doppler readings, with medial and lateral E' around 3. RV appears thickened as well.  -I also reviewed her CXR, largely unimpressive in terms of edema.  -this may all be apical HCM, but I am concerned that there may be another process as well, such as infiltrative disease.  -she has already received feraheme this admission, unclear whether checking iron studies after infusion would have utility. -would like to get cardiac MRI for further evaluation. May also need genetic testing. No clear  history of storage diseases in the family (Fabry, etc) but cannot be excluded.  Anemia: -Hgb 8.7, was 9.5 yesterday. rechecking -Hgb 11 on admission for delivery  Hyponatremia: -sodium only 124 this AM, 133 yesterday--rechecking BMET-->sodium 122 -would not be expected with the use of only a loop diuretic (not thiazide) -holding lasix. Encouraged salt intake. No neurologic symptoms.   -recheck BMET -Currently @ 7:21 PM NA 128 trending up with PO salt intake. Consulted with DR Landry Mellow to ok to be discharged home with continued PO salt, report cardia arhythmia, sob, cp.   Overall the HCM may be contributing to some of her symptoms, but there are additional concerning findings that do not appear related (low albumin, worsening anemia, now hyponatremia with only low dose diuretics). Rechecking labs, but if this is consistent, may need further workup. Urine protein not consistent with nephrotic range. Adding thyroid studies today resulted WNL. Her urine is dilute based on specific gravity. If recheck BMET is accurate in terms of the sodium level, may need urine electrolyte studies for further evaluation.  Physical exam  Vitals:   06/05/20 0415 06/05/20 0823 06/05/20 1200 06/05/20 1653  BP: 114/67 109/70 115/66 125/72  Pulse: 90 89 88 90  Resp: 18 17 16 16   Temp: 98 F (36.7 C) (!) 97.4 F (36.3 C) 97.8 F (36.6 C) 97.8 F (36.6 C)  TempSrc: Oral Oral Oral Oral  SpO2: 95% 96% 98% 98%  Weight: 57.6 kg     Height:       General: alert, cooperative and no distress Lochia: appropriate Uterine Fundus: firm Perineum: Intact DVT Evaluation: No evidence of DVT seen on physical exam. Negative Homan's sign. No cords or calf tenderness.  GEN:No acute distress.   Neck:No JVD sitting upright Cardiac:RRR, no murmurs, rubs. +S3.  Respiratory:Clear to auscultation bilaterally. VQ:MGQQ, nontender, non-distended  PY:PPJKDTOIZ 2+ pitting edema; No deformity. Neuro:Nonfocal  Psych: Normal affect   Labs: Lab Results  Component Value Date   WBC 5.4 06/05/2020   HGB 8.7 (L) 06/05/2020   HCT 25.3 (L) 06/05/2020   MCV 90.0 06/05/2020   PLT 292 06/05/2020   CMP Latest Ref Rng & Units 06/05/2020  Glucose 70 - 99 mg/dL 103(H)  BUN 6 - 20 mg/dL 5(L)  Creatinine 0.44 - 1.00 mg/dL 0.59  Sodium 135 - 145 mmol/L 128(L)  Potassium 3.5 - 5.1 mmol/L 3.8  Chloride 98 - 111 mmol/L  98  CO2 22 - 32 mmol/L 24  Calcium 8.9 - 10.3 mg/dL 8.5(L)  Total Protein 6.5 - 8.1 g/dL -  Total Bilirubin 0.3 - 1.2 mg/dL -  Alkaline Phos 38 - 126 U/L -  AST 15 - 41 U/L -  ALT 0 - 44 U/L -    Date of discharge: 06/05/2020 Discharge Diagnoses: postpartum cardiomyopathy  Discharge instruction: per After Visit Summary and "Baby and Me Booklet".  After visit meds:   Activity:           unrestricted and pelvic rest Advance as tolerated. Pelvic rest for 6 weeks.  Diet:                routine Medications: PNV and Ibuprofen Postpartum contraception: Undecided Condition:  Pt discharge to home with baby in stable Cardiomyopathy: F/U with cards, cleared by cards to be discharge and f/u out pt.  Hyponatremia: Resolved Hyponatremia: Resolving with PO salt intake, continue.   Meds: Allergies as of 06/05/2020      Reactions   Penicillins       Medication  List    TAKE these medications   famotidine 20 MG tablet Commonly known as: PEPCID Take 1 tablet (20 mg total) by mouth 2 (two) times daily for 365 doses.   ibuprofen 600 MG tablet Commonly known as: ADVIL Take 1 tablet (600 mg total) by mouth every 6 (six) hours.   iron polysaccharides 150 MG capsule Commonly known as: NIFEREX Take 1 capsule (150 mg total) by mouth daily.   prenatal multivitamin Tabs tablet Take 1 tablet by mouth daily at 12 noon.   Vitamin C 100 MG Chew Chew by mouth.       Discharge Follow Up:   Follow-up [facility]. Schedule an appointment as soon as possible for a visit in 1 week(s).   Specialty: Obstetrics and Gynecology Contact information: [address] [place] [address] [phone]       [facility] Follow up.   Why: f/u per card, out pt, and for MRI cardiac Contact information: [place] [address] [place], NP-C, [place] 06/05/2020, 7:21 PM  Noralyn Pick, [place]

## 2020-06-05 NOTE — Progress Notes (Signed)
Progress Note  Patient Name: Lynn Cardenas Date of Encounter: 06/05/2020  [facility] Cardiologist: None   Subjective   No acute events overnight. Trying to eat more salt. Swelling is down to just being in her feet. Husband and father in the room with her today. No heart history on her father's side, mother's side is unknown. Reviewed recommendations for further evaluation from a heart perspective. Sodium remains low despite holding diuretics.  Inpatient Medications    Scheduled Meds: . potassium chloride  20 mEq Oral BID  . prenatal multivitamin  1 tablet Oral Q1200   Continuous Infusions:  PRN Meds: acetaminophen, calcium carbonate   Vital Signs    Vitals:   06/04/20 1920 06/05/20 0415 06/05/20 0823 06/05/20 1200  BP: 111/70 114/67 109/70 115/66  Pulse: 88 90 89 88  Resp: 17 18 17 16   Temp: 98.2 F (36.8 C) 98 F (36.7 C) (!) 97.4 F (36.3 C) 97.8 F (36.6 C)  TempSrc: Axillary Oral Oral Oral  SpO2: 100% 95% 96% 98%  Weight:  57.6 kg    Height:        Intake/Output Summary (Last 24 hours) at 06/05/2020 1314 Last data filed at 06/05/2020 1205 Gross per 24 hour  Intake 1260 ml  Output 1150 ml  Net 110 ml   Last 3 Weights 06/05/2020 06/04/2020 06/03/2020  Weight (lbs) 127 lb 128 lb 8 oz 128 lb 9.6 oz  Weight (kg) 57.607 kg 58.287 kg 58.333 kg      Telemetry     not on - Personally Reviewed  ECG    06/03/20 NSR, LAE, LVH - Personally Reviewed  Physical Exam   GEN: Well nourished, well developed in no acute distress NECK: No JVD CARDIAC: regular rhythm, normal S1 and S2, no rubs, +S3. No murmur. VASCULAR: Radial pulses 2+ bilaterally.  RESPIRATORY:  Clear to auscultation without rales, wheezing or rhonchi  ABDOMEN: Soft, non-tender, non-distended MUSCULOSKELETAL:  Moves all 4 limbs independently SKIN: Warm and dry, 2+ pitting edema in feet bilaterally NEUROLOGIC:  No focal neuro deficits noted. PSYCHIATRIC:  Normal affect   Labs     High Sensitivity Troponin:  No results for input(s): TROPONINIHS in the last 720 hours.    Chemistry Recent Labs  Lab 06/03/20 1600 06/04/20 0451 06/04/20 0833 06/04/20 1642 06/05/20 0436  NA 133* 124* 122* 127* 124*  K 4.0 3.8 3.9 3.7 3.3*  CL 103 94* 95* 95* 94*  CO2 22 18* 17* 22 22  GLUCOSE 66* 68* 84 87 101*  BUN 5* 5* 6 6 7   CREATININE 0.57 0.64 0.54 0.48 0.60  CALCIUM 9.1 8.3* 8.2* 8.6* 8.2*  PROT 5.9* 4.9*  --   --   --   ALBUMIN 2.8* 2.4*  --   --   --   AST 62* 52*  --   --   --   ALT 40 33  --   --   --   ALKPHOS 119 98  --   --   --   BILITOT 1.4* 1.2  --   --   --   GFRNONAA >60 >60 >60 >60 >60  ANIONGAP 8 12 10 10 8      Hematology Recent Labs  Lab 06/04/20 0451 06/04/20 0833 06/05/20 0436  WBC 6.2 6.4 5.4  RBC 2.82* 2.75* 2.81*  HGB 8.7* 8.6* 8.7*  HCT 26.3* 25.0* 25.3*  MCV 93.3 90.9 90.0  MCH 30.9 31.3 31.0  MCHC 33.1 34.4 34.4  RDW 13.8 13.4  13.2  PLT 295 290 292    BNP Recent Labs  Lab 06/03/20 1736  BNP 2,642.8*     DDimer No results for input(s): DDIMER in the last 168 hours.   Radiology    DG Chest Port 1 View  Result Date: 06/03/2020 CLINICAL DATA:  Shortness of breath.  Postpartum. EXAM: PORTABLE CHEST 1 VIEW COMPARISON:  None. FINDINGS: No significant airspace disease or lung consolidation. Prominent structures in left suprahilar region could be related to prominent vascular structures. Evidence for a small azygos lobe. Heart size is upper limits of normal but likely accentuated by the AP portable technique. IMPRESSION: 1. Slightly prominent central vascular structures possibly related to recent pregnancy. 2. No focal airspace disease. Electronically Signed   By: Markus Daft M.D.   On: 06/03/2020 15:12   ECHOCARDIOGRAM COMPLETE  Result Date: 06/03/2020    ECHOCARDIOGRAM REPORT   Patient Name:   Lynn Cardenas Date of Exam: 06/03/2020 Medical Rec #:  [id number]               Height:       60.0 in Accession #:    [id number]               Weight:       128.6 lb Date of Birth:  20-Oct-1990               BSA:          1.547 m Patient Age:    30 years                BP:           129/68 mmHg Patient Gender: F                       HR:           86 bpm. Exam Location:  Inpatient Procedure: 2D Echo, Cardiac Doppler and Color Doppler Indications:    Other abnormalities of the heart  History:        Patient has no prior history of Echocardiogram examinations.                 Signs/Symptoms:Shortness of Breath. Patient four days                 post-partum. Orthopnea.  Sonographer:    Clayton Lefort RDCS (AE) Referring Phys: [id number] Darlina Rumpf  Sonographer Comments: Patient in Fowler's position due to orthopnea. IMPRESSIONS  1. 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 There is mid-cavity obliteration and a small volume of apical entrapment. There is no LV outflow obstruction. Left ventricular ejection fraction, by estimation, is  >75%. The left ventricle has hyperdynamic function. The left ventricle has no regional wall motion abnormalities. There is severe asymmetric left ventricular hypertrophy of the apical segment. Left ventricular diastolic parameters are consistent with Grade II diastolic dysfunction (pseudonormalization). Elevated left atrial pressure.  2. Right ventricular systolic function is normal. The right ventricular size is normal. There is moderately elevated pulmonary artery systolic pressure.  3. Left atrial size was severely dilated.  4. The pericardial effusion is circumferential.  5. The mitral valve is normal in structure. Trivial mitral valve regurgitation.  6. The aortic valve is tricuspid. Aortic valve regurgitation is not visualized. No aortic stenosis is present.  7. There is prominent a wave reversal in the hepatic veins (elevated RV end-diastolic pressure). The inferior vena cava is dilated in  size with <50% respiratory variability, suggesting right atrial pressure of  15 mmHg. 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 There is mid-cavity obliteration and a small volume of apical entrapment. There is no LV outflow obstruction. Left ventricular ejection fraction, by estimation, is >75%. The left ventricle has hyperdynamic function. The left ventricle has no regional wall motion abnormalities. The left ventricular internal cavity size was small. There is severe asymmetric left ventricular hypertrophy of the apical segment. Left ventricular diastolic parameters are consistent with Grade II diastolic dysfunction (pseudonormalization). Elevated left atrial pressure. Right Ventricle: The right ventricular size is normal. No increase in right ventricular wall thickness. Right ventricular systolic function is normal. There is moderately elevated pulmonary artery systolic pressure. The tricuspid regurgitant velocity is 3.07 m/s, and with an assumed right atrial pressure of 15 mmHg, the estimated right ventricular systolic pressure is 97.0 mmHg. Left Atrium: Left atrial size was severely dilated. Right Atrium: Right atrial size was normal in size. Pericardium: Trivial pericardial effusion is present. The pericardial effusion is circumferential. Mitral Valve: The mitral valve is normal in structure. Trivial mitral valve regurgitation. MV peak gradient, 2.4 mmHg. The mean mitral valve gradient is 1.0 mmHg. Tricuspid Valve: The tricuspid valve is normal in structure. Tricuspid valve regurgitation is trivial. Aortic Valve: The aortic valve is tricuspid. Aortic valve regurgitation is not visualized. No aortic stenosis is present. Aortic valve mean gradient measures 3.0 mmHg. Aortic valve peak gradient measures 8.2 mmHg. Aortic valve area, by VTI measures 1.87 cm. Pulmonic Valve: The pulmonic valve was normal in structure. Pulmonic valve regurgitation is not visualized. Aorta: The aortic root is normal in size and structure.  Venous: There is prominent a wave reversal in the hepatic veins (elevated RV end-diastolic pressure). The inferior vena cava is dilated in size with less than 50% respiratory variability, suggesting right atrial pressure of 15 mmHg. IAS/Shunts: No atrial level shunt detected by color flow Doppler.  LEFT VENTRICLE PLAX 2D LVIDd:         3.70 cm  Diastology LVIDs:         2.70 cm  LV e' medial:    3.08 cm/s LV PW:         1.90 cm  LV E/e' medial:  15.1 LV IVS:        1.65 cm  LV e' lateral:   3.45 cm/s LVOT diam:     1.60 cm  LV E/e' lateral: 13.5 LV SV:         39 LV SV Index:   25 LVOT Area:     2.01 cm  RIGHT VENTRICLE             IVC RV Basal diam:  2.90 cm     IVC diam: 2.50 cm RV S prime:     10.60 cm/s TAPSE (M-mode): 1.7 cm LEFT ATRIUM              Index       RIGHT ATRIUM           Index LA diam:        4.56 cm  2.95 cm/m  RA Area:     20.60 cm LA Vol (A2C):   117.0 ml 75.62 ml/m RA Volume:   60.80 ml  39.30 ml/m LA Vol (A4C):   112.0 ml 72.39 ml/m LA Biplane Vol: 115.0 ml 74.33 ml/m  AORTIC VALVE AV Area (Vmax):    1.72 cm AV Area (Vmean):  1.77 cm AV Area (VTI):     1.87 cm AV Vmax:           143.00 cm/s AV Vmean:          82.400 cm/s AV VTI:            0.211 m AV Peak Grad:      8.2 mmHg AV Mean Grad:      3.0 mmHg LVOT Vmax:         122.00 cm/s LVOT Vmean:        72.500 cm/s LVOT VTI:          0.196 m LVOT/AV VTI ratio: 0.93  AORTA Ao Root diam: 3.10 cm Ao Asc diam:  2.50 cm MITRAL VALVE               TRICUSPID VALVE MV Area (PHT): 3.83 cm    TR Peak grad:   37.7 mmHg MV Area VTI:   2.77 cm    TR Vmax:        307.00 cm/s MV Peak grad:  2.4 mmHg MV Mean grad:  1.0 mmHg    SHUNTS MV Vmax:       0.78 m/s    Systemic VTI:  0.20 m MV Vmean:      41.8 cm/s   Systemic Diam: 1.60 cm MV Decel Time: 198 msec MV E velocity: 46.50 cm/s MV A velocity: 31.50 cm/s MV E/A ratio:  1.48 Mihai Croitoru MD Electronically signed by Sanda Klein MD Signature Date/Time: 06/03/2020/7:01:11 PM    Final    VAS [place]  LOWER EXTREMITY VENOUS (DVT) (ONLY [facility] & [facility])  Result Date: 06/04/2020  Lower Venous DVT Study Patient Name:  ESSANCE GATTI  Date of Exam:   06/03/2020 Medical Rec #: [id number]                Accession #:    [id number] Date of Birth: [birth year]                Patient Gender: F Patient Age:   67Y Exam Location:  [facility] Procedure:      VAS [place] LOWER EXTREMITY VENOUS (DVT) Referring Phys: [id number] Darlina Rumpf --------------------------------------------------------------------------------  Indications: 4 days post partum with new onset worsening pitting edema, shortness of breath, and slight cough.  Comparison Study: No prior study Performing Technologist: Maudry Mayhew MHA, RDMS, RVT, RDCS  Examination Guidelines: A complete evaluation includes B-mode imaging, spectral Doppler, color Doppler, and power Doppler as needed of all accessible portions of each vessel. Bilateral testing is considered an integral part of a complete examination. Limited examinations for reoccurring indications may be performed as noted. The reflux portion of the exam is performed with the patient in reverse Trendelenburg.  +---------+---------------+---------+-----------+----------+--------------+ RIGHT    CompressibilityPhasicitySpontaneityPropertiesThrombus Aging +---------+---------------+---------+-----------+----------+--------------+ CFV      Full           No       Yes                                 +---------+---------------+---------+-----------+----------+--------------+ SFJ      Full                                                        +---------+---------------+---------+-----------+----------+--------------+  FV Prox  Full                                                        +---------+---------------+---------+-----------+----------+--------------+ FV Mid   Full                                                         +---------+---------------+---------+-----------+----------+--------------+ FV DistalFull                                                        +---------+---------------+---------+-----------+----------+--------------+ PFV      Full                                                        +---------+---------------+---------+-----------+----------+--------------+ POP      Full           No       Yes                                 +---------+---------------+---------+-----------+----------+--------------+ PTV      Full                                                        +---------+---------------+---------+-----------+----------+--------------+ PERO     Full                                                        +---------+---------------+---------+-----------+----------+--------------+   +---------+---------------+---------+-----------+----------+--------------+ LEFT     CompressibilityPhasicitySpontaneityPropertiesThrombus Aging +---------+---------------+---------+-----------+----------+--------------+ CFV      Full           No       Yes                                 +---------+---------------+---------+-----------+----------+--------------+ SFJ      Full                                                        +---------+---------------+---------+-----------+----------+--------------+ FV Prox  Full                                                        +---------+---------------+---------+-----------+----------+--------------+  FV Mid   Full                                                        +---------+---------------+---------+-----------+----------+--------------+ FV DistalFull                                                        +---------+---------------+---------+-----------+----------+--------------+ PFV      Full                                                         +---------+---------------+---------+-----------+----------+--------------+ POP      Full           No       Yes                                 +---------+---------------+---------+-----------+----------+--------------+ PTV      Full                                                        +---------+---------------+---------+-----------+----------+--------------+ PERO     Full                                                        +---------+---------------+---------+-----------+----------+--------------+     Summary: RIGHT: - There is no evidence of deep vein thrombosis in the lower extremity.  - No cystic structure found in the popliteal fossa.  LEFT: - There is no evidence of deep vein thrombosis in the lower extremity.  - No cystic structure found in the popliteal fossa.  Pulsatile lower extremity venous flow is suggestive of possibly elevated right-heart pressure.  *See table(s) above for measurements and observations. Electronically signed by Monica Martinez MD on 06/04/2020 at 11:00:21 AM.    Final     Cardiac Studies   Echo 06/03/20 1. The overall appearance is consistent with hypertrophic cardiomyopathy,  apical variant. There is mid-cavity obliteration and a small volume of  apical entrapment. There is no LV outflow obstruction. Left ventricular  ejection fraction, by estimation, is  >75%. The left ventricle has hyperdynamic function. The left ventricle  has no regional wall motion abnormalities. There is severe asymmetric left  ventricular hypertrophy of the apical segment. Left ventricular diastolic  parameters are consistent with  Grade II diastolic dysfunction (pseudonormalization). Elevated left atrial  pressure.  2. Right ventricular systolic function is normal. The right ventricular  size is normal. There is moderately elevated pulmonary artery systolic  pressure.  3. Left atrial size was severely dilated.  4. The pericardial effusion is circumferential.   5. The mitral valve is normal  in structure. Trivial mitral valve  regurgitation.  6. The aortic valve is tricuspid. Aortic valve regurgitation is not  visualized. No aortic stenosis is present.  7. There is prominent a wave reversal in the hepatic veins (elevated RV  end-diastolic pressure). The inferior vena cava is dilated in size with  <50% respiratory variability, suggesting right atrial pressure of 15 mmHg.   Patient Profile     30 y.o. female with recent SVD of her first child, presenting with shortness of breath, orthopnea, and edema. 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 Likely contributing to third spacing -Cr 0.57 on admission, 0.60 today -K 3.3 today, being repleted -BNP 2642  Summary of my note from 06/04/20: Echo shows severe LVH without LVOT obstruction. Given young age, question infiltrative disease. There are severely reduced tissue doppler readings, with medial and lateral E' around 3. RV appears thickened as well. I am concerned for restrictive cardiomyopathy and cannot exclude infiltrative disease -would like to get cardiac MRI for further evaluation. May also need genetic testing. No clear history of storage diseases in the family (Fabry, etc) but cannot be excluded. cMRI can be done as an outpatient.  Anemia: -Hgb 8.7, has been stable on last 3 draws -Hgb 11 on admission for delivery -reports history of anemia. Has received feraheme. Is vegan.  Hyponatremia: -sodium only 124 this AM, nadir 122. Was 133 on 06/03/20 -drop is more than expected for use of short term lasix. -holding lasix. Encouraged salt intake. No neurologic symptoms.  -this is likely barrier to  discharge, need to make sure it is normal/trending appropriately. May need to involve medicine team if remains low.  Overall the HCM may be contributing to some of her symptoms, but there are additional concerning findings that do not appear related (low albumin, worsening anemia, now hyponatremia with only low dose diuretics).   [facility] will sign off.   Medication Recommendations: none Other recommendations (labs, testing, etc):  Wear compression stockings, elevate feet when sitting Follow up as an outpatient:  I will arrange for her to see me in clinic, and we will discuss cardiac MRI at that time. Call with weight gain, increasing fluid, or worsening shortness of breath.  For questions or updates, please contact [place] Please consult [email or web address] for contact info under        Signed, Buford Dresser, MD  06/05/2020, 1:14 PM

## 2020-06-05 NOTE — Plan of Care (Signed)
Patient presently in NSR on telemetry, denies SOB,chest pain, H/A, blurry vision, or RUQ pain. Has 2-3 plus peripheral edema but lungs are clear bilaterally. Follow up care and what to call the MD for discussed and patient verbalized understanding.

## 2020-06-05 NOTE — Lactation Note (Signed)
Lactation Consultation Note  Patient Name: Lynn Cardenas OHKGO'V Date: 06/05/2020   Age:30 y.o. Mom stated she had some soreness with latching. LC examined found her breasts to be soft. Mom provided with comfort gels to wear, rinse in between use and discard after 6 days. Mom aware to not use coconut oil with comfort gels. No abrasions noted on the breasts.   Mom pumped only once today. LC reviewed with Mom the need to pump q 3hrs on maintenance phase for 20 minutes. RN,Chi, to assist Mom with switching from initial to maintenance on DEBP. LC also reviewed the signs, symptoms, treatment and prevention of engorgement.   LC also reviewed with Mom behavior of LPTI to reduce calorie loss and the importance of supplementing after latching. Mom provided with purple extra slow flow nipples and paced bottle feeding reviewed.. Mom aware to latch first, then supplement and pump. Mom aware total feeding should remain under 30 minutes.   Maternal Data    Feeding    LATCH Score                    Lactation Tools Discussed/Used    Interventions    Discharge    Consult Status      Lynn Bobe  Cardenas 06/05/2020, 2:02 PM

## 2020-06-13 DIAGNOSIS — I422 Other hypertrophic cardiomyopathy: Secondary | ICD-10-CM | POA: Diagnosis not present

## 2020-06-29 ENCOUNTER — Other Ambulatory Visit: Payer: Self-pay

## 2020-06-29 ENCOUNTER — Ambulatory Visit: Payer: BC Managed Care – PPO | Admitting: Physician Assistant

## 2020-06-29 ENCOUNTER — Encounter: Payer: Self-pay | Admitting: Physician Assistant

## 2020-06-29 VITALS — BP 100/60 | HR 89 | Ht 60.0 in | Wt 102.0 lb

## 2020-06-29 DIAGNOSIS — I421 Obstructive hypertrophic cardiomyopathy: Secondary | ICD-10-CM

## 2020-06-29 NOTE — Patient Instructions (Signed)
Medication Instructions:  Your physician recommends that you continue on your current medications as directed. Please refer to the Current Medication list given to you today.  *If you need a refill on your cardiac medications before your next appointment, please call your pharmacy*   Lab Work: NONE ordered at this time of appointment   If you have labs (blood work) drawn today and your tests are completely normal, you will receive your results only by: [place] (if you have MyChart) OR A paper copy in the mail If you have any lab test that is abnormal or we need to change your treatment, we will call you to review the results.   Testing/Procedures: NONE ordered at this time of appointment     Follow-Up: At [facility], you and your health needs are our priority.  As part of our continuing mission to provide you with exceptional heart care, we have created designated Provider Care Teams.  These Care Teams include your primary Cardiologist (physician) and Advanced Practice Providers (APPs -  Physician Assistants and Nurse Practitioners) who all work together to provide you with the care you need, when you need it.   Your next appointment:   3-4 month(s)  The format for your next appointment:   In Person  Provider:   Buford Dresser, MD  Other Instructions

## 2020-06-29 NOTE — Progress Notes (Signed)
Cardiology Office Note:    Date:  06/30/2020   ID:  Lynn Cardenas, DOB 10-07-1990, MRN [id number]  PCP:  Patient, No Pcp Per (Inactive)   [place] [facility] Cardiologist:  Buford Dresser, MD     Referring MD: No ref. provider found   Chief Complaint  Patient presents with   Hospitalization Follow-up    Recent leg edema     History of Present Illness:    Lynn Cardenas is a 30 y.o. 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  Patient recently had vaginal delivery on 06/01/2020.  After delivery, she saw her OB for follow-up and had a shortness of breath, bilateral lower extremity and a recurrent cough.  Echocardiogram and a lower extremity Doppler were obtained.  Lower extremity Doppler did not show any DVT however echocardiogram showed hypertrophic cardiomyopathy with apical variant and mid cavity obliteration and a small volume of apical entrapment, there was no alveolar obstruction, grade 2 DD, moderate elevated PSAP, severely dilated left atrium, small circumferential pericardial effusion and trivial TR.  She was given 20 mg Lasix with improvement.  She was diuresed 3.4 L.  Albumin was low at 2.8, likely contributing to third spacing.  Lasix was stopped prior to discharge.  She did have some degree of hyponatremia with sodium as low as 122.  Patient was seen by Dr. Harrell Gave who recommended cardiac MRI to rule out restrictive cardiac disease and patient may also need genetic testing as well.  Patient presents to the ED with her newborn and her spouse.  On exam, she does not have any significant lower extremity edema.  She does not feel short of breath and has no recent chest pain.  Her lungs is clear on exam.  I do not appreciate a significant heart murmur on exam.  I will discuss with Dr. Harrell Gave tomorrow to see if we should proceed with cardiac MRI and genetic testing.  My main question is whether gadolinium  enhancement during cardio MRI will interfere with her breast-feeding.   Past Medical History:  Diagnosis Date   Fibroid    Herpes genitalis in women    on serpressive therapy     Past Surgical History:  Procedure Laterality Date   NO PAST SURGERIES      Current Medications: Current Meds  Medication Sig   Ascorbic Acid (VITAMIN C) 100 MG CHEW Chew by mouth.   Prenatal Vit-Fe Fumarate-FA (PRENATAL MULTIVITAMIN) TABS tablet Take 1 tablet by mouth daily at 12 noon.   [DISCONTINUED] famotidine (PEPCID) 20 MG tablet Take 1 tablet (20 mg total) by mouth 2 (two) times daily for 365 doses.   [DISCONTINUED] ibuprofen (ADVIL) 600 MG tablet Take 1 tablet (600 mg total) by mouth every 6 (six) hours.   [DISCONTINUED] iron polysaccharides (NIFEREX) 150 MG capsule Take 1 capsule (150 mg total) by mouth daily.     Allergies:   Penicillins   Social History   Socioeconomic History   Marital status: Married    Spouse name: Not on file   Number of children: Not on file   Years of education: Not on file   Highest education level: Not on file  Occupational History   Not on file  Tobacco Use   Smoking status: Never   Smokeless tobacco: Never  Vaping Use   Vaping Use: Never used  Substance and Sexual Activity   Alcohol use: Never   Drug use: Never   Sexual activity: Not Currently  Other  Topics Concern   Not on file  Social History Narrative   Not on file   Social Determinants of Health   Financial Resource Strain: Not on file  Food Insecurity: Not on file  Transportation Needs: Not on file  Physical Activity: Not on file  Stress: Not on file  Social Connections: Not on file     Family History: The patient's family history is not on file.  ROS:   Please see the history of present illness.     All other systems reviewed and are negative.  EKGs/Labs/Other Studies Reviewed:    The following studies were reviewed today:  Echo 06/03/2020 1. The overall appearance is  consistent with hypertrophic cardiomyopathy,  apical variant. There is mid-cavity obliteration and a small volume of  apical entrapment. There is no LV outflow obstruction. Left ventricular  ejection fraction, by estimation, is   >75%. The left ventricle has hyperdynamic function. The left ventricle  has no regional wall motion abnormalities. There is severe asymmetric left  ventricular hypertrophy of the apical segment. Left ventricular diastolic  parameters are consistent with  Grade II diastolic dysfunction (pseudonormalization). Elevated left atrial  pressure.   2. Right ventricular systolic function is normal. The right ventricular  size is normal. There is moderately elevated pulmonary artery systolic  pressure.   3. Left atrial size was severely dilated.   4. The pericardial effusion is circumferential.   5. The mitral valve is normal in structure. Trivial mitral valve  regurgitation.   6. The aortic valve is tricuspid. Aortic valve regurgitation is not  visualized. No aortic stenosis is present.   7. There is prominent a wave reversal in the hepatic veins (elevated RV  end-diastolic pressure). The inferior vena cava is dilated in size with  <50% respiratory variability, suggesting right atrial pressure of 15 mmHg  EKG:  EKG is not ordered today.    Recent Labs: 06/03/2020: B Natriuretic Peptide 2,642.8 06/04/2020: ALT 33; TSH 1.605 06/05/2020: BUN 5; Creatinine, Ser 0.59; Hemoglobin 8.7; Platelets 292; Potassium 3.8; Sodium 128  Recent Lipid Panel No results found for: CHOL, TRIG, HDL, CHOLHDL, VLDL, LDLCALC, LDLDIRECT   Risk Assessment/Calculations:           Physical Exam:    VS:  BP 100/60   Pulse 89   Ht 5' (1.524 m)   Wt 102 lb (46.3 kg)   SpO2 94%   Breastfeeding Yes   BMI 19.92 kg/m     Wt Readings from Last 3 Encounters:  06/29/20 102 lb (46.3 kg)  06/05/20 127 lb (57.6 kg)  05/30/20 138 lb (62.6 kg)     GEN:  Well nourished, well developed in no  acute distress HEENT: Normal NECK: No JVD; No carotid bruits LYMPHATICS: No lymphadenopathy CARDIAC: RRR, no murmurs, rubs, gallops RESPIRATORY:  Clear to auscultation without rales, wheezing or rhonchi  ABDOMEN: Soft, non-tender, non-distended MUSCULOSKELETAL:  No edema; No deformity  SKIN: Warm and dry NEUROLOGIC:  Alert and oriented x 3 PSYCHIATRIC:  Normal affect   ASSESSMENT:    1. Obstructive hypertrophic cardiomyopathy (HCC)    PLAN:    In order of problems listed above:  Apical variant hypertrophic cardiomyopathy: Recently seen by Dr. Harrell Gave in the ED for lower extremity swelling, subsequent echocardiogram showed apical variant of hypertrophic cardiomyopathy.  ED note mentioned genetic testing and possibility of cardiac MRI to rule out restrictive physiology.  Only question is whether gadolinium contrast associated with cardiac MRI is doable during breast-feeding.  I will  double check with MD regarding this.        Medication Adjustments/Labs and Tests Ordered: Current medicines are reviewed at length with the patient today.  Concerns regarding medicines are outlined above.  No orders of the defined types were placed in this encounter.  No orders of the defined types were placed in this encounter.   Patient Instructions  Medication Instructions:  Your physician recommends that you continue on your current medications as directed. Please refer to the Current Medication list given to you today.  *If you need a refill on your cardiac medications before your next appointment, please call your pharmacy*   Lab Work: NONE ordered at this time of appointment   If you have labs (blood work) drawn today and your tests are completely normal, you will receive your results only by: [place] (if you have MyChart) OR A paper copy in the mail If you have any lab test that is abnormal or we need to change your treatment, we will call you to review the  results.   Testing/Procedures: NONE ordered at this time of appointment     Follow-Up: At [facility], you and your health needs are our priority.  As part of our continuing mission to provide you with exceptional heart care, we have created designated Provider Care Teams.  These Care Teams include your primary Cardiologist (physician) and Advanced Practice Providers (APPs -  Physician Assistants and Nurse Practitioners) who all work together to provide you with the care you need, when you need it.   Your next appointment:   3-4 month(s)  The format for your next appointment:   In Person  Provider:   Buford Dresser, MD  Other Instructions    Signed, Almyra Deforest, [place]  06/30/2020 9:08 AM    [place]

## 2020-06-30 ENCOUNTER — Telehealth: Payer: Self-pay | Admitting: Physician Assistant

## 2020-06-30 ENCOUNTER — Encounter: Payer: Self-pay | Admitting: Physician Assistant

## 2020-06-30 NOTE — Telephone Encounter (Signed)
I discussed her case with Dr. Harrell Gave, there is no absolute contraindication with cardiac MRI during breast-feeding.  However according to [employer], it is recommended to discard breastmilk for the first 24 to 48 hours after cardiac MRI with gadolinium contrast.  I have called the patient twice to discuss this, unfortunately she did not pick up.  I did leave her message.  We will attempt again this afternoon.  We are also considering referring the patient for genetic testing of hypertrophic cardiomyopathy.

## 2020-07-01 ENCOUNTER — Other Ambulatory Visit: Payer: Self-pay | Admitting: Physician Assistant

## 2020-07-01 DIAGNOSIS — E871 Hypo-osmolality and hyponatremia: Secondary | ICD-10-CM

## 2020-07-01 NOTE — Telephone Encounter (Signed)
I spoke with the patient, since she just gave birth, her child is still relying on breastmilk.  She do not think she can afford to dump breastmilk for 48 hours.  I informed her that cardiac MRI is not urgent at this time.  She will consider cardiac MRI and genetic testing with her child can start on formula.

## 2020-07-12 ENCOUNTER — Other Ambulatory Visit: Payer: Self-pay

## 2020-07-12 DIAGNOSIS — E871 Hypo-osmolality and hyponatremia: Secondary | ICD-10-CM

## 2020-07-13 LAB — BASIC METABOLIC PANEL
BUN/Creatinine Ratio: 27 — ABNORMAL HIGH (ref 9–23)
BUN: 19 mg/dL (ref 6–20)
CO2: 20 mmol/L (ref 20–29)
Calcium: 10.1 mg/dL (ref 8.7–10.2)
Chloride: 106 mmol/L (ref 96–106)
Creatinine, Ser: 0.7 mg/dL (ref 0.57–1.00)
Glucose: 82 mg/dL (ref 65–99)
Potassium: 4.7 mmol/L (ref 3.5–5.2)
Sodium: 141 mmol/L (ref 134–144)
eGFR: 121 mL/min/{1.73_m2} (ref >59)

## 2020-07-14 NOTE — Progress Notes (Signed)
Stable renal function and electrolyte. Previously seen low sodium level has normalized.

## 2020-09-29 ENCOUNTER — Ambulatory Visit (HOSPITAL_BASED_OUTPATIENT_CLINIC_OR_DEPARTMENT_OTHER): Payer: BC Managed Care – PPO | Admitting: Cardiology

## 2022-05-24 IMAGING — MR MR PELVIS W/O CM
14 series · 47 of 48 positions shown · non-contrast
Comparison: Pelvic ultrasound December 15, 2013.

CLINICAL DATA: Umbilical hernia 23 weeks pregnant in acute
abdominal pain.

EXAM:
MRI PELVIS WITHOUT CONTRAST
TECHNIQUE: Multiplanar multisequence MR imaging of the pelvis was performed. No
intravenous contrast was administered.

[Series 4: cor haste plvs · coronal · 6.0mm · 1.25mm/px · 2 of 31 slices shown]
[im 1/31]
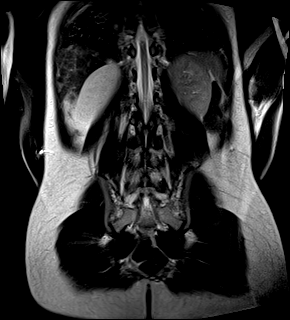
[im 31/31]
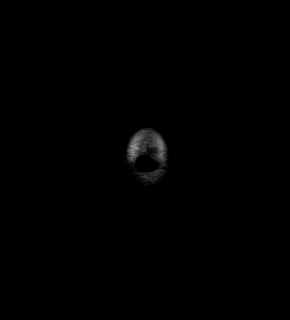

[Series 5: sag tse plvs · sagittal · 5.0mm · 0.61mm/px · 3 of 38 slices shown]
[im 1/38]
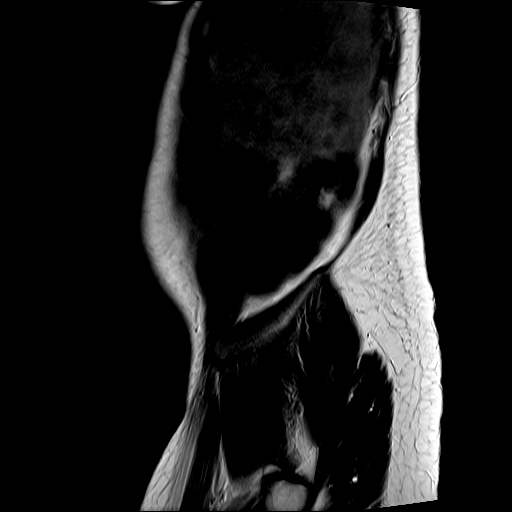
[im 19/38]
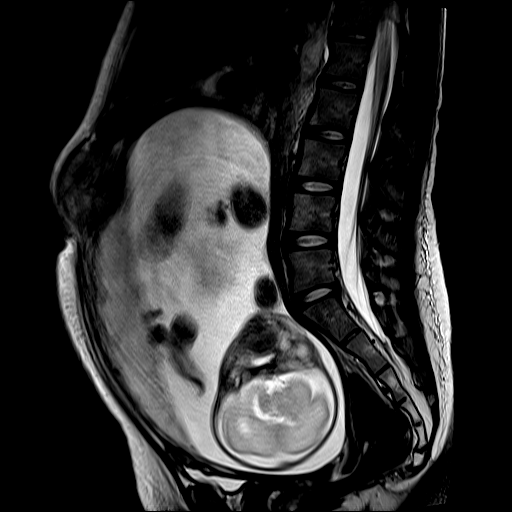
[im 38/38]
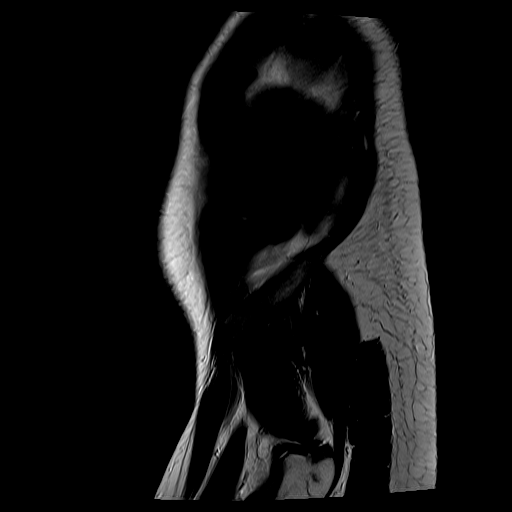

[Series 6: ax in and · axial · 3.0mm · 1.19mm/px · z∈[-18,+135]mm · 5 of 52 slices shown (1 of 4)]
[im 1/52]
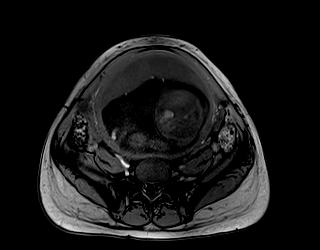
[im 13/52]
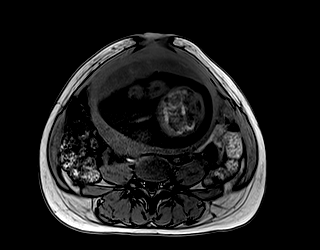
[im 26/52]
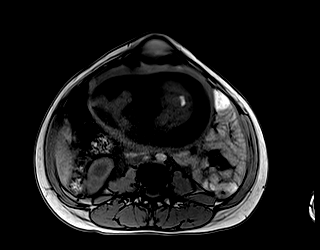
[im 39/52]
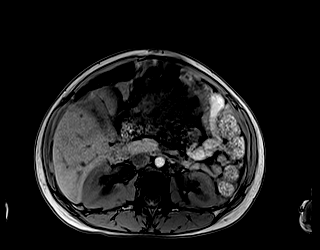
[im 52/52]
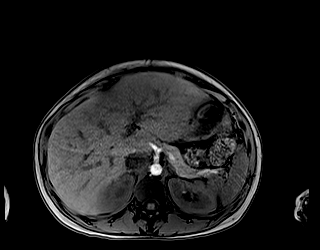

[Series 6: ax in and · axial · 3.0mm · 1.19mm/px · z∈[-18,+135]mm · 5 of 52 slices shown (2 of 4)]
[im 1/52]
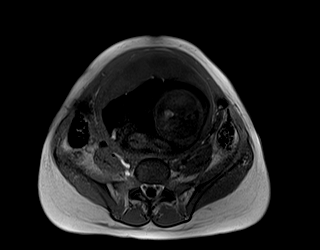
[im 13/52]
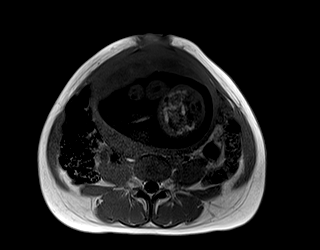
[im 26/52]
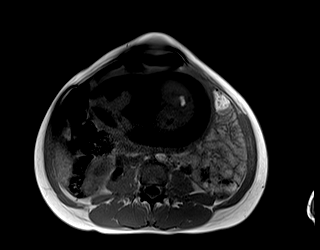
[im 39/52]
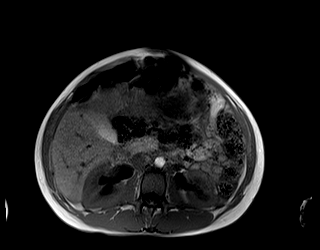
[im 52/52]
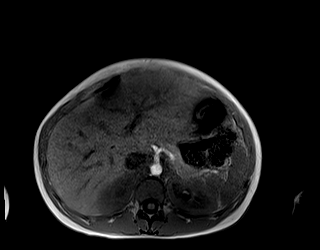

[Series 7: ax in and · axial · 3.0mm · 1.19mm/px · z∈[-148,+5]mm · 5 of 52 slices shown (3 of 4)]
[im 1/52]
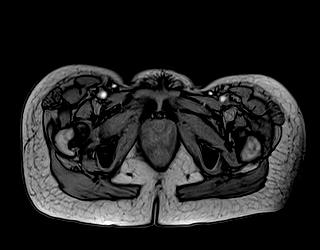
[im 13/52]
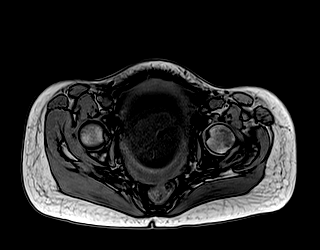
[im 26/52]
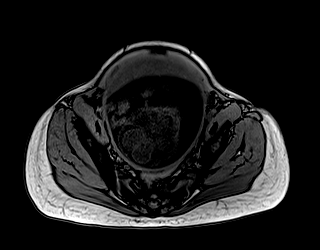
[im 39/52]
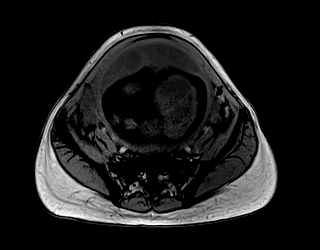
[im 52/52]
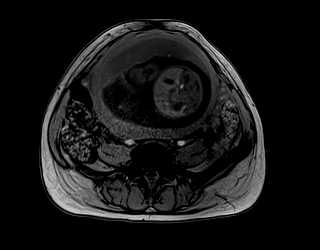

[Series 7: ax in and · axial · 3.0mm · 1.19mm/px · z∈[-148,+5]mm · 5 of 52 slices shown (4 of 4)]
[im 1/52]
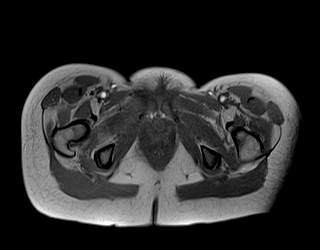
[im 13/52]
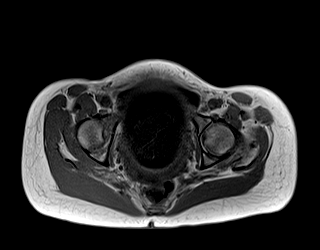
[im 26/52]
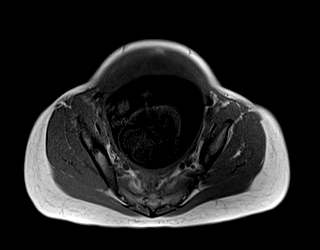
[im 39/52]
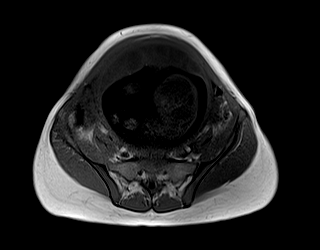
[im 52/52]
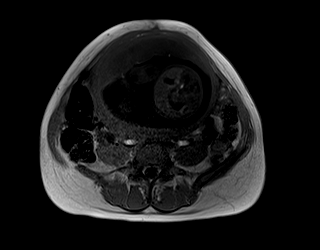

[Series 10: ax haste · axial · 6.0mm · 1.19mm/px · z∈[-37,+155]mm · 3 of 30 slices shown (1 of 2)]
[im 1/30]
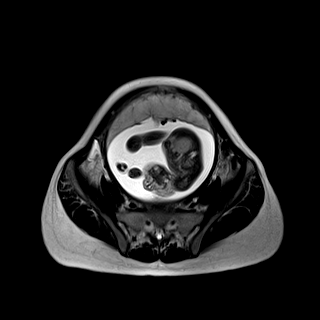
[im 15/30]
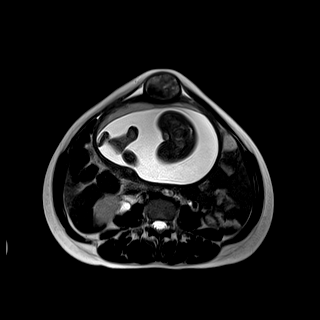
[im 30/30]
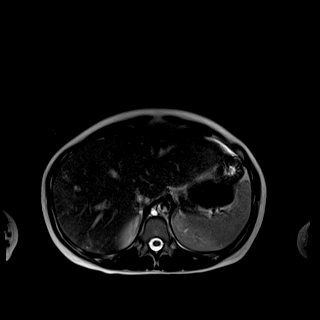

[Series 11: ax haste · axial · 6.0mm · 1.19mm/px · z∈[-171,+21]mm · 3 of 30 slices shown (2 of 2)]
[im 1/30]
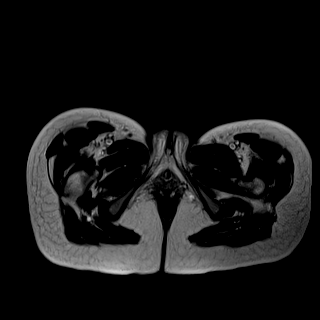
[im 15/30]
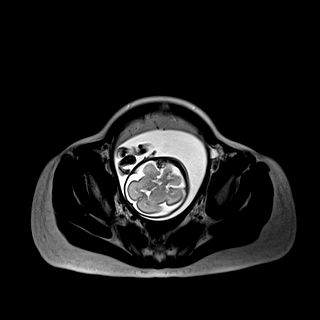
[im 30/30]
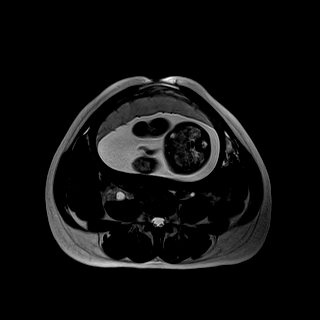

[Series 14: DWI · axial · 6.0mm · 1.42mm/px · z∈[-145,-9]mm · 6 of 60 slices shown (1 of 2)]
[im 1/60]
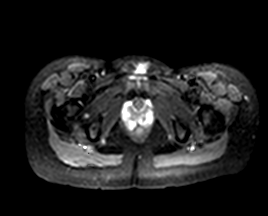
[im 12/60]
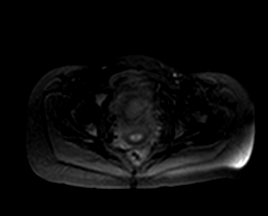
[im 24/60]
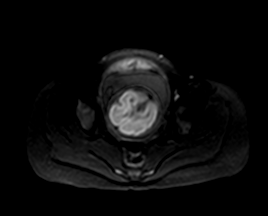
[im 36/60]
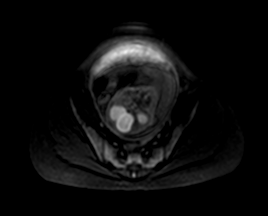
[im 48/60]
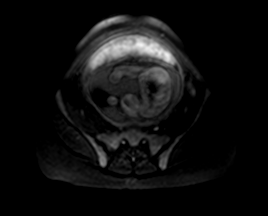
[im 60/60]
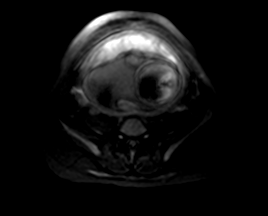

[Series 15: DWI · axial · 6.0mm · 1.42mm/px · z∈[-145,-9]mm · 2 of 20 slices shown (2 of 2)]
[im 1/20]
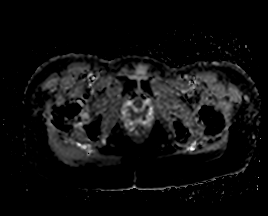
[im 20/20]
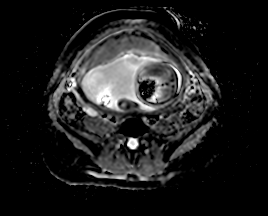

[Series 18: T1 fat-sat · axial · 6.0mm · 1.19mm/px · z∈[-144,-19]mm · 2 of 20 slices shown (1 of 2)]
[im 1/20]
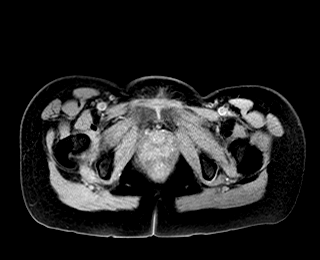
[im 20/20]
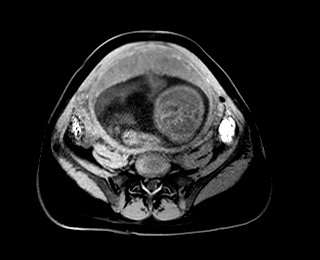

[Series 19: T1 fat-sat · axial · 6.0mm · 1.19mm/px · z∈[-37,+155]mm · 3 of 30 slices shown (2 of 2)]
[im 1/30]
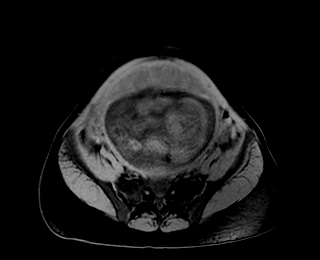
[im 15/30]
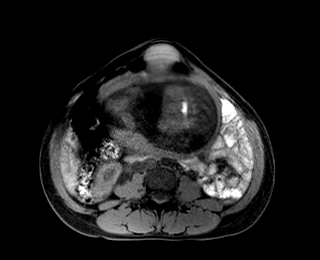
[im 30/30]
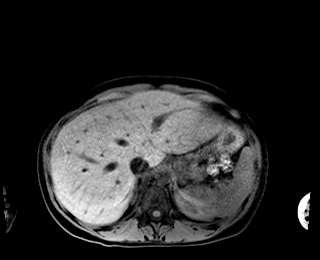

[Series 20: T2 fat-sat · axial · 4.5mm · 0.62mm/px · z∈[-19,+111]mm · 2 of 25 slices shown (1 of 2)]
[im 1/25]
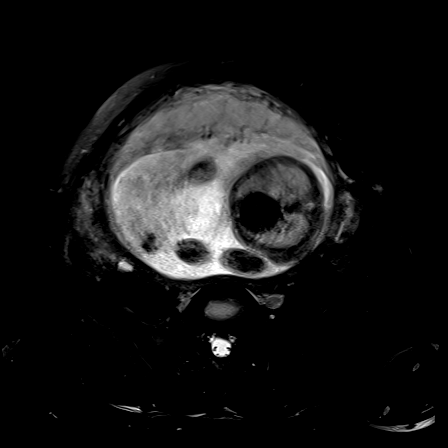
[im 25/25]
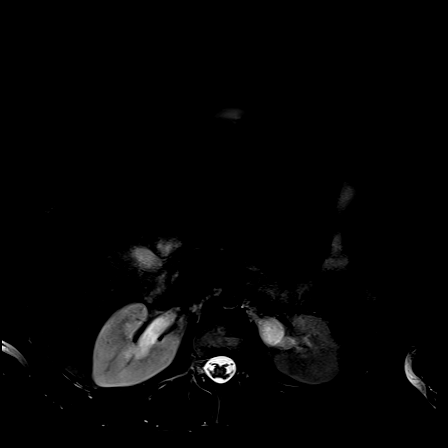

[Series 21: T2 fat-sat · axial · 4.5mm · 0.62mm/px · 1 of 25 slices shown (2 of 2)]
[im 1/25]
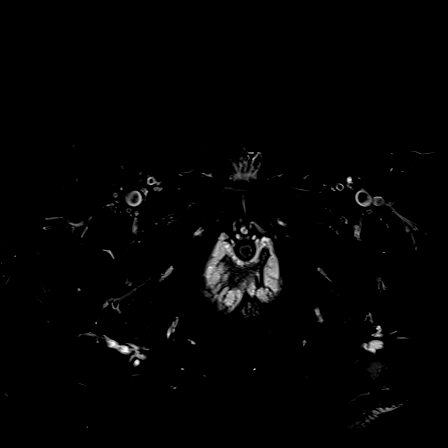

[47 of 48 positions shown; findings below may reference images not displayed]

FINDINGS: Urinary Tract: Moderate right and mild left hydronephrosis, to the
level of the gravid uterus.

Bowel:  Unremarkable visualized bowel loops.

Vascular/Lymphatic: No pathologically enlarged lymph nodes. No
significant vascular abnormality seen.

Reproductive: Gravid uterus. Study is not optimized for the
evaluation of intrauterine placenta. There is a 4.1 x 3.2 cm
submucosal mass extending from the anterior aspect of the uterus
which demonstrates heterogeneous T2 and T1 signal.

Other: Trace ascites. There is extension of the uterine mass into
mild diastasis rectus.

Musculoskeletal: No suspicious bone lesions identified.
IMPRESSION: 1. There is a 4.1 x 3.2 cm submucosal mass extending from the
anterior aspect of the uterus which demonstrates heterogeneous T2
and T1 signal, with extension of the uterine mass into mild
diastasis rectus. Findings are suspicious for a degenerating
fibroid.
2. Gestational moderate right and mild left hydronephrosis, to the
level of the gravid uterus.
3. Gravid uterus, however study is not optimized for the evaluation
of intrauterine pregnancy or placenta.
